# Patient Record
Sex: Male | Born: 1935 | Race: White | Hispanic: No
Health system: Southern US, Community
[De-identification: ages and names within clinical notes are randomized; demographics above are authoritative.]

## PROBLEM LIST (undated history)

## (undated) DIAGNOSIS — J309 Allergic rhinitis, unspecified: Secondary | ICD-10-CM

## (undated) DIAGNOSIS — Z87442 Personal history of urinary calculi: Secondary | ICD-10-CM

## (undated) DIAGNOSIS — E538 Deficiency of other specified B group vitamins: Secondary | ICD-10-CM

## (undated) DIAGNOSIS — Z8669 Personal history of other diseases of the nervous system and sense organs: Secondary | ICD-10-CM

## (undated) DIAGNOSIS — L4050 Arthropathic psoriasis, unspecified: Secondary | ICD-10-CM

## (undated) DIAGNOSIS — Z8521 Personal history of malignant neoplasm of larynx: Secondary | ICD-10-CM

## (undated) DIAGNOSIS — D649 Anemia, unspecified: Secondary | ICD-10-CM

## (undated) DIAGNOSIS — J449 Chronic obstructive pulmonary disease, unspecified: Secondary | ICD-10-CM

## (undated) DIAGNOSIS — J8410 Pulmonary fibrosis, unspecified: Secondary | ICD-10-CM

## (undated) DIAGNOSIS — N400 Enlarged prostate without lower urinary tract symptoms: Secondary | ICD-10-CM

## (undated) DIAGNOSIS — I10 Essential (primary) hypertension: Secondary | ICD-10-CM

## (undated) DIAGNOSIS — E559 Vitamin D deficiency, unspecified: Secondary | ICD-10-CM

## (undated) DIAGNOSIS — I739 Peripheral vascular disease, unspecified: Secondary | ICD-10-CM

## (undated) DIAGNOSIS — K219 Gastro-esophageal reflux disease without esophagitis: Secondary | ICD-10-CM

## (undated) DIAGNOSIS — E7800 Pure hypercholesterolemia, unspecified: Secondary | ICD-10-CM

## (undated) HISTORY — PX: HEMORRHOID SURGERY: SHX153

## (undated) HISTORY — PX: APPENDECTOMY: SHX54

## (undated) HISTORY — PX: OTHER SURGICAL HISTORY: SHX169

## (undated) HISTORY — PX: NECK SURGERY: SHX720

---

## 2019-01-06 ENCOUNTER — Encounter (HOSPITAL_COMMUNITY)
Admission: EM | Disposition: A | Discharge status: Home / Self Care | Payer: Self-pay | Source: Home / Self Care | Attending: Emergency Medicine

## 2019-01-06 ENCOUNTER — Inpatient Hospital Stay (HOSPITAL_COMMUNITY): Payer: Medicare Other

## 2019-01-06 ENCOUNTER — Emergency Department (HOSPITAL_COMMUNITY): Payer: Medicare Other

## 2019-01-06 ENCOUNTER — Inpatient Hospital Stay (HOSPITAL_COMMUNITY): Payer: Medicare Other | Admitting: Anesthesiology

## 2019-01-06 ENCOUNTER — Other Ambulatory Visit: Payer: Self-pay

## 2019-01-06 ENCOUNTER — Observation Stay (HOSPITAL_BASED_OUTPATIENT_CLINIC_OR_DEPARTMENT_OTHER)
Admission: EM | Admit: 2019-01-06 | Discharge: 2019-01-07 | Disposition: A | Discharge status: Home / Self Care | Payer: Medicare Other | Source: Home / Self Care | Attending: Emergency Medicine | Admitting: Emergency Medicine

## 2019-01-06 ENCOUNTER — Encounter (HOSPITAL_COMMUNITY): Payer: Self-pay | Admitting: Emergency Medicine

## 2019-01-06 DIAGNOSIS — I714 Abdominal aortic aneurysm, without rupture, unspecified: Secondary | ICD-10-CM

## 2019-01-06 DIAGNOSIS — Z1159 Encounter for screening for other viral diseases: Secondary | ICD-10-CM | POA: Insufficient documentation

## 2019-01-06 DIAGNOSIS — E559 Vitamin D deficiency, unspecified: Secondary | ICD-10-CM | POA: Insufficient documentation

## 2019-01-06 DIAGNOSIS — K219 Gastro-esophageal reflux disease without esophagitis: Secondary | ICD-10-CM | POA: Insufficient documentation

## 2019-01-06 DIAGNOSIS — I700 Atherosclerosis of aorta: Secondary | ICD-10-CM | POA: Insufficient documentation

## 2019-01-06 DIAGNOSIS — N179 Acute kidney failure, unspecified: Secondary | ICD-10-CM | POA: Insufficient documentation

## 2019-01-06 DIAGNOSIS — Z79899 Other long term (current) drug therapy: Secondary | ICD-10-CM | POA: Insufficient documentation

## 2019-01-06 DIAGNOSIS — I723 Aneurysm of iliac artery: Secondary | ICD-10-CM | POA: Insufficient documentation

## 2019-01-06 DIAGNOSIS — Z885 Allergy status to narcotic agent status: Secondary | ICD-10-CM | POA: Insufficient documentation

## 2019-01-06 DIAGNOSIS — F1010 Alcohol abuse, uncomplicated: Secondary | ICD-10-CM | POA: Insufficient documentation

## 2019-01-06 DIAGNOSIS — I701 Atherosclerosis of renal artery: Secondary | ICD-10-CM | POA: Insufficient documentation

## 2019-01-06 DIAGNOSIS — N132 Hydronephrosis with renal and ureteral calculous obstruction: Secondary | ICD-10-CM | POA: Insufficient documentation

## 2019-01-06 DIAGNOSIS — L4050 Arthropathic psoriasis, unspecified: Secondary | ICD-10-CM | POA: Insufficient documentation

## 2019-01-06 DIAGNOSIS — J8410 Pulmonary fibrosis, unspecified: Secondary | ICD-10-CM | POA: Insufficient documentation

## 2019-01-06 DIAGNOSIS — F0390 Unspecified dementia without behavioral disturbance: Secondary | ICD-10-CM | POA: Insufficient documentation

## 2019-01-06 DIAGNOSIS — N200 Calculus of kidney: Secondary | ICD-10-CM | POA: Diagnosis not present

## 2019-01-06 DIAGNOSIS — I739 Peripheral vascular disease, unspecified: Secondary | ICD-10-CM | POA: Insufficient documentation

## 2019-01-06 DIAGNOSIS — N261 Atrophy of kidney (terminal): Secondary | ICD-10-CM | POA: Insufficient documentation

## 2019-01-06 DIAGNOSIS — H3530 Unspecified macular degeneration: Secondary | ICD-10-CM | POA: Insufficient documentation

## 2019-01-06 DIAGNOSIS — Z7952 Long term (current) use of systemic steroids: Secondary | ICD-10-CM | POA: Insufficient documentation

## 2019-01-06 DIAGNOSIS — Z8521 Personal history of malignant neoplasm of larynx: Secondary | ICD-10-CM | POA: Insufficient documentation

## 2019-01-06 DIAGNOSIS — Z881 Allergy status to other antibiotic agents status: Secondary | ICD-10-CM | POA: Insufficient documentation

## 2019-01-06 DIAGNOSIS — I2510 Atherosclerotic heart disease of native coronary artery without angina pectoris: Secondary | ICD-10-CM | POA: Insufficient documentation

## 2019-01-06 DIAGNOSIS — I10 Essential (primary) hypertension: Secondary | ICD-10-CM | POA: Insufficient documentation

## 2019-01-06 DIAGNOSIS — N201 Calculus of ureter: Secondary | ICD-10-CM

## 2019-01-06 DIAGNOSIS — Z87891 Personal history of nicotine dependence: Secondary | ICD-10-CM | POA: Insufficient documentation

## 2019-01-06 DIAGNOSIS — J439 Emphysema, unspecified: Secondary | ICD-10-CM | POA: Insufficient documentation

## 2019-01-06 DIAGNOSIS — Z87442 Personal history of urinary calculi: Secondary | ICD-10-CM | POA: Insufficient documentation

## 2019-01-06 DIAGNOSIS — Z882 Allergy status to sulfonamides status: Secondary | ICD-10-CM | POA: Insufficient documentation

## 2019-01-06 HISTORY — DX: Allergic rhinitis, unspecified: J30.9

## 2019-01-06 HISTORY — DX: Personal history of urinary calculi: Z87.442

## 2019-01-06 HISTORY — DX: Personal history of other diseases of the nervous system and sense organs: Z86.69

## 2019-01-06 HISTORY — DX: Personal history of malignant neoplasm of larynx: Z85.21

## 2019-01-06 HISTORY — DX: Gastro-esophageal reflux disease without esophagitis: K21.9

## 2019-01-06 HISTORY — DX: Benign prostatic hyperplasia without lower urinary tract symptoms: N40.0

## 2019-01-06 HISTORY — DX: Vitamin D deficiency, unspecified: E55.9

## 2019-01-06 HISTORY — DX: Chronic obstructive pulmonary disease, unspecified: J44.9

## 2019-01-06 HISTORY — DX: Peripheral vascular disease, unspecified: I73.9

## 2019-01-06 HISTORY — DX: Deficiency of other specified B group vitamins: E53.8

## 2019-01-06 HISTORY — DX: Arthropathic psoriasis, unspecified: L40.50

## 2019-01-06 HISTORY — DX: Pure hypercholesterolemia, unspecified: E78.00

## 2019-01-06 HISTORY — DX: Essential (primary) hypertension: I10

## 2019-01-06 HISTORY — DX: Pulmonary fibrosis, unspecified: J84.10

## 2019-01-06 HISTORY — PX: CYSTOSCOPY W/ URETERAL STENT PLACEMENT: SHX1429

## 2019-01-06 HISTORY — DX: Anemia, unspecified: D64.9

## 2019-01-06 LAB — URINALYSIS, ROUTINE W REFLEX MICROSCOPIC
Bilirubin Urine: NEGATIVE
Glucose, UA: NEGATIVE mg/dL
Ketones, ur: NEGATIVE mg/dL
Leukocytes,Ua: NEGATIVE
Nitrite: NEGATIVE
Protein, ur: NEGATIVE mg/dL
RBC / HPF: >50 RBC/hpf — ABNORMAL HIGH (ref 0–5)
Specific Gravity, Urine: 1.024 (ref 1.005–1.030)
pH: 5 (ref 5.0–8.0)

## 2019-01-06 LAB — COMPREHENSIVE METABOLIC PANEL
ALT: 23 U/L (ref 0–44)
AST: 23 U/L (ref 15–41)
Albumin: 4.4 g/dL (ref 3.5–5.0)
Alkaline Phosphatase: 43 U/L (ref 38–126)
Anion gap: 10 (ref 5–15)
BUN: 25 mg/dL — ABNORMAL HIGH (ref 8–23)
CO2: 25 mmol/L (ref 22–32)
Calcium: 9.8 mg/dL (ref 8.9–10.3)
Chloride: 104 mmol/L (ref 98–111)
Creatinine, Ser: 1.74 mg/dL — ABNORMAL HIGH (ref 0.61–1.24)
GFR calc Af Amer: 41 mL/min — ABNORMAL LOW (ref >60)
GFR calc non Af Amer: 35 mL/min — ABNORMAL LOW (ref >60)
Glucose, Bld: 146 mg/dL — ABNORMAL HIGH (ref 70–99)
Potassium: 3.8 mmol/L (ref 3.5–5.1)
Sodium: 139 mmol/L (ref 135–145)
Total Bilirubin: 0.6 mg/dL (ref 0.3–1.2)
Total Protein: 8.1 g/dL (ref 6.5–8.1)

## 2019-01-06 LAB — TYPE AND SCREEN
ABO/RH(D): A POS
Antibody Screen: NEGATIVE

## 2019-01-06 LAB — LACTIC ACID, PLASMA: Lactic Acid, Venous: 1.5 mmol/L (ref 0.5–1.9)

## 2019-01-06 LAB — CBC
HCT: 43.7 % (ref 39.0–52.0)
Hemoglobin: 13.1 g/dL (ref 13.0–17.0)
MCH: 30.5 pg (ref 26.0–34.0)
MCHC: 30 g/dL (ref 30.0–36.0)
MCV: 101.6 fL — ABNORMAL HIGH (ref 80.0–100.0)
Platelets: 196 10*3/uL (ref 150–400)
RBC: 4.3 MIL/uL (ref 4.22–5.81)
RDW: 13.8 % (ref 11.5–15.5)
WBC: 8.7 10*3/uL (ref 4.0–10.5)
nRBC: 0 % (ref 0.0–0.2)

## 2019-01-06 LAB — MRSA PCR SCREENING: MRSA by PCR: NEGATIVE

## 2019-01-06 LAB — LIPASE, BLOOD: Lipase: 26 U/L (ref 11–51)

## 2019-01-06 LAB — ABO/RH: ABO/RH(D): A POS

## 2019-01-06 LAB — SARS CORONAVIRUS 2 BY RT PCR (HOSPITAL ORDER, PERFORMED IN ~~LOC~~ HOSPITAL LAB): SARS Coronavirus 2: NEGATIVE

## 2019-01-06 SURGERY — CYSTOSCOPY, WITH RETROGRADE PYELOGRAM AND URETERAL STENT INSERTION
Anesthesia: General | Laterality: Left

## 2019-01-06 MED ORDER — SODIUM CHLORIDE 0.9 % IR SOLN
Status: DC | PRN
  Administered 2019-01-06: 3000 mL via INTRAVESICAL

## 2019-01-06 MED ORDER — ALBUTEROL SULFATE (2.5 MG/3ML) 0.083% IN NEBU
2.5000 mg | INHALATION_SOLUTION | RESPIRATORY_TRACT | Status: DC | PRN
  Administered 2019-01-07: 2.5 mg via RESPIRATORY_TRACT
  Filled 2019-01-06: qty 3

## 2019-01-06 MED ORDER — SODIUM CHLORIDE (PF) 0.9 % IJ SOLN
INTRAMUSCULAR | Status: AC
  Filled 2019-01-06: qty 50

## 2019-01-06 MED ORDER — IOHEXOL 300 MG/ML  SOLN
INTRAMUSCULAR | Status: DC | PRN
  Administered 2019-01-06: 20:00:00 5 mL via URETHRAL

## 2019-01-06 MED ORDER — HEPARIN SODIUM (PORCINE) 5000 UNIT/ML IJ SOLN
5000.0000 [IU] | Freq: Three times a day (TID) | INTRAMUSCULAR | Status: DC
  Administered 2019-01-06 – 2019-01-07 (×2): 5000 [IU] via SUBCUTANEOUS
  Filled 2019-01-06 (×2): qty 1

## 2019-01-06 MED ORDER — SODIUM CHLORIDE 0.9% FLUSH: 3.0000 mL | Freq: Once | INTRAVENOUS | Status: DC

## 2019-01-06 MED ORDER — CEFAZOLIN SODIUM-DEXTROSE 2-4 GM/100ML-% IV SOLN
2.0000 g | Freq: Once | INTRAVENOUS | Status: AC
  Administered 2019-01-06: 2 g via INTRAVENOUS
  Filled 2019-01-06: qty 100

## 2019-01-06 MED ORDER — HYDROMORPHONE HCL 1 MG/ML IJ SOLN
1.0000 mg | Freq: Once | INTRAMUSCULAR | Status: AC
  Administered 2019-01-06: 1 mg via INTRAVENOUS
  Filled 2019-01-06: qty 1

## 2019-01-06 MED ORDER — HYDROMORPHONE HCL 1 MG/ML IJ SOLN
0.5000 mg | Freq: Four times a day (QID) | INTRAMUSCULAR | Status: DC | PRN
  Administered 2019-01-06 – 2019-01-07 (×3): 0.5 mg via INTRAVENOUS
  Filled 2019-01-06 (×3): qty 0.5

## 2019-01-06 MED ORDER — VITAMIN D 25 MCG (1000 UNIT) PO TABS
5000.0000 [IU] | ORAL_TABLET | Freq: Every day | ORAL | Status: DC
  Administered 2019-01-07: 5000 [IU] via ORAL
  Filled 2019-01-06 (×2): qty 5

## 2019-01-06 MED ORDER — ADULT MULTIVITAMIN W/MINERALS CH
1.0000 | ORAL_TABLET | Freq: Every day | ORAL | Status: DC
  Administered 2019-01-07: 1 via ORAL
  Filled 2019-01-06: qty 1

## 2019-01-06 MED ORDER — LORATADINE 10 MG PO TABS
10.0000 mg | ORAL_TABLET | Freq: Every day | ORAL | Status: DC
  Administered 2019-01-07: 10 mg via ORAL
  Filled 2019-01-06: qty 1

## 2019-01-06 MED ORDER — IOHEXOL 350 MG/ML SOLN
100.0000 mL | Freq: Once | INTRAVENOUS | Status: AC | PRN
  Administered 2019-01-06: 04:00:00 80 mL via INTRAVENOUS

## 2019-01-06 MED ORDER — LABETALOL HCL 5 MG/ML IV SOLN
5.0000 mg | Freq: Once | INTRAVENOUS | Status: AC
  Administered 2019-01-06: 5 mg via INTRAVENOUS

## 2019-01-06 MED ORDER — ACETAMINOPHEN 325 MG PO TABS: 650.0000 mg | ORAL_TABLET | Freq: Four times a day (QID) | ORAL | Status: DC | PRN

## 2019-01-06 MED ORDER — LORAZEPAM 2 MG/ML IJ SOLN
1.0000 mg | Freq: Four times a day (QID) | INTRAMUSCULAR | Status: DC | PRN
  Administered 2019-01-06: 1 mg via INTRAVENOUS
  Filled 2019-01-06: qty 1

## 2019-01-06 MED ORDER — ACETAMINOPHEN 650 MG RE SUPP: 650.0000 mg | Freq: Four times a day (QID) | RECTAL | Status: DC | PRN

## 2019-01-06 MED ORDER — LABETALOL HCL 5 MG/ML IV SOLN
INTRAVENOUS | Status: AC
  Administered 2019-01-06: 5 mg via INTRAVENOUS
  Filled 2019-01-06: qty 4

## 2019-01-06 MED ORDER — LOSARTAN POTASSIUM 25 MG PO TABS: 25.0000 mg | ORAL_TABLET | Freq: Every day | ORAL | Status: DC

## 2019-01-06 MED ORDER — ONDANSETRON HCL 4 MG/2ML IJ SOLN
4.0000 mg | Freq: Four times a day (QID) | INTRAMUSCULAR | Status: DC | PRN
  Administered 2019-01-06: 4 mg via INTRAVENOUS

## 2019-01-06 MED ORDER — FENTANYL CITRATE (PF) 100 MCG/2ML IJ SOLN
INTRAMUSCULAR | Status: AC
  Filled 2019-01-06: qty 2

## 2019-01-06 MED ORDER — HYDRALAZINE HCL 20 MG/ML IJ SOLN
10.0000 mg | Freq: Three times a day (TID) | INTRAMUSCULAR | Status: DC | PRN
  Administered 2019-01-06: 10 mg via INTRAVENOUS
  Filled 2019-01-06: qty 1

## 2019-01-06 MED ORDER — THIAMINE HCL 100 MG/ML IJ SOLN: 100.0000 mg | Freq: Every day | INTRAMUSCULAR | Status: DC

## 2019-01-06 MED ORDER — FENTANYL CITRATE (PF) 100 MCG/2ML IJ SOLN
INTRAMUSCULAR | Status: DC | PRN
  Administered 2019-01-06: 25 ug via INTRAVENOUS
  Administered 2019-01-06: 50 ug via INTRAVENOUS
  Administered 2019-01-06: 25 ug via INTRAVENOUS

## 2019-01-06 MED ORDER — SODIUM CHLORIDE 0.9 % IV SOLN
INTRAVENOUS | Status: DC | PRN
  Administered 2019-01-06: 19:00:00 20 ug/min via INTRAVENOUS

## 2019-01-06 MED ORDER — VITAMIN B-1 100 MG PO TABS
100.0000 mg | ORAL_TABLET | Freq: Every day | ORAL | Status: DC
  Administered 2019-01-07: 100 mg via ORAL
  Filled 2019-01-06: qty 1

## 2019-01-06 MED ORDER — SODIUM CHLORIDE 0.9 % IV SOLN
1.0000 g | INTRAVENOUS | Status: DC
  Filled 2019-01-06: qty 10

## 2019-01-06 MED ORDER — PREDNISONE 5 MG PO TABS
10.0000 mg | ORAL_TABLET | Freq: Every day | ORAL | Status: DC
  Administered 2019-01-07: 10 mg via ORAL
  Filled 2019-01-06: qty 2

## 2019-01-06 MED ORDER — TRAMADOL HCL 50 MG PO TABS: 50.0000 mg | ORAL_TABLET | Freq: Two times a day (BID) | ORAL | Status: DC | PRN

## 2019-01-06 MED ORDER — PANTOPRAZOLE SODIUM 40 MG PO TBEC: 40.0000 mg | DELAYED_RELEASE_TABLET | Freq: Every day | ORAL | Status: DC

## 2019-01-06 MED ORDER — SODIUM CHLORIDE 0.9 % IV SOLN
INTRAVENOUS | Status: DC
  Administered 2019-01-06 (×2): via INTRAVENOUS

## 2019-01-06 MED ORDER — LACTATED RINGERS IV SOLN
INTRAVENOUS | Status: DC
  Administered 2019-01-06: 18:00:00 via INTRAVENOUS

## 2019-01-06 MED ORDER — GUAIFENESIN ER 600 MG PO TB12
1200.0000 mg | ORAL_TABLET | Freq: Every day | ORAL | Status: DC
  Administered 2019-01-07: 1200 mg via ORAL
  Filled 2019-01-06: qty 2

## 2019-01-06 MED ORDER — DOCUSATE SODIUM 100 MG PO CAPS: 100.0000 mg | ORAL_CAPSULE | Freq: Every evening | ORAL | Status: DC | PRN

## 2019-01-06 MED ORDER — LORAZEPAM 1 MG PO TABS: 1.0000 mg | ORAL_TABLET | Freq: Four times a day (QID) | ORAL | Status: DC | PRN

## 2019-01-06 MED ORDER — SODIUM CHLORIDE 0.9 % IV BOLUS
500.0000 mL | Freq: Once | INTRAVENOUS | Status: AC
  Administered 2019-01-06: 500 mL via INTRAVENOUS

## 2019-01-06 MED ORDER — SENNOSIDES-DOCUSATE SODIUM 8.6-50 MG PO TABS: 1.0000 | ORAL_TABLET | Freq: Every evening | ORAL | Status: DC | PRN

## 2019-01-06 MED ORDER — MYCOPHENOLATE MOFETIL 250 MG PO CAPS
1000.0000 mg | ORAL_CAPSULE | Freq: Two times a day (BID) | ORAL | Status: DC
  Administered 2019-01-07: 1000 mg via ORAL
  Filled 2019-01-06 (×3): qty 4

## 2019-01-06 MED ORDER — DEXAMETHASONE SODIUM PHOSPHATE 10 MG/ML IJ SOLN
INTRAMUSCULAR | Status: DC | PRN
  Administered 2019-01-06: 4 mg via INTRAVENOUS

## 2019-01-06 MED ORDER — PROPOFOL 10 MG/ML IV BOLUS
INTRAVENOUS | Status: DC | PRN
  Administered 2019-01-06: 120 mg via INTRAVENOUS

## 2019-01-06 MED ORDER — ONDANSETRON HCL 4 MG PO TABS: 4.0000 mg | ORAL_TABLET | Freq: Four times a day (QID) | ORAL | Status: DC | PRN

## 2019-01-06 MED ORDER — LIDOCAINE 2% (20 MG/ML) 5 ML SYRINGE
INTRAMUSCULAR | Status: DC | PRN
  Administered 2019-01-06: 100 mg via INTRAVENOUS

## 2019-01-06 MED ORDER — FOLIC ACID 1 MG PO TABS
1.0000 mg | ORAL_TABLET | Freq: Every day | ORAL | Status: DC
  Administered 2019-01-07: 1 mg via ORAL
  Filled 2019-01-06: qty 1

## 2019-01-06 SURGICAL SUPPLY — 16 items
BAG URO CATCHER STRL LF (MISCELLANEOUS) ×3 IMPLANT
BASKET ZERO TIP NITINOL 2.4FR (BASKET) IMPLANT
CATH INTERMIT  6FR 70CM (CATHETERS) IMPLANT
CLOTH BEACON ORANGE TIMEOUT ST (SAFETY) ×3 IMPLANT
COVER WAND RF STERILE (DRAPES) IMPLANT
GLOVE BIOGEL M STRL SZ7.5 (GLOVE) ×7 IMPLANT
GOWN STRL REUS W/TWL LRG LVL3 (GOWN DISPOSABLE) ×6 IMPLANT
GUIDEWIRE ANG ZIPWIRE 038X150 (WIRE) ×3 IMPLANT
GUIDEWIRE STR DUAL SENSOR (WIRE) ×2 IMPLANT
KIT TURNOVER KIT A (KITS) IMPLANT
MANIFOLD NEPTUNE II (INSTRUMENTS) ×3 IMPLANT
PACK CYSTO (CUSTOM PROCEDURE TRAY) ×3 IMPLANT
STENT POLARIS 5FRX26 (STENTS) ×2 IMPLANT
TUBING CONNECTING 10 (TUBING) ×2 IMPLANT
TUBING CONNECTING 10' (TUBING) ×1
TUBING UROLOGY SET (TUBING) ×2 IMPLANT

## 2019-01-06 NOTE — Anesthesia Preprocedure Evaluation (Addendum)
Anesthesia Evaluation  Patient identified by MRN, date of birth, ID band Patient awake    Reviewed: Allergy & Precautions, NPO status , Patient's Chart, lab work & pertinent test results  History of Anesthesia Complications Negative for: history of anesthetic complications  Airway Mallampati: III  TM Distance: <3 FB Neck ROM: Limited    Dental   Pulmonary sleep apnea , COPD, former smoker,  Pulmonary fibrosis    + wheezing      Cardiovascular hypertension, + Peripheral Vascular Disease  Normal cardiovascular exam     Neuro/Psych negative psych ROS   GI/Hepatic GERD  ,(+)     substance abuse  alcohol use,   Endo/Other  negative endocrine ROS  Renal/GU Renal InsufficiencyRenal disease  negative genitourinary   Musculoskeletal  (+) Arthritis ,   Abdominal   Peds  Hematology negative hematology ROS (+)   Anesthesia Other Findings HTN, COPD w/ 150 PY hx (home O2 2-3.5 L), AAA/PVD, nephrolithiasis, psoriatic arthritis, GERD, AKI on CKD, EtOH use, h/o cervical fusion, h/o laryngeal cancer s/p surgical removal (no XRT)  Echo 07/09/18: EF >65%, RVSP 40-45, no significant valve abnormality  Reproductive/Obstetrics                            Anesthesia Physical Anesthesia Plan  ASA: III  Anesthesia Plan: General   Post-op Pain Management:    Induction: Intravenous  PONV Risk Score and Plan: 2 and Ondansetron, Dexamethasone and Treatment may vary due to age or medical condition  Airway Management Planned: LMA  Additional Equipment: None  Intra-op Plan:   Post-operative Plan: Extubation in OR  Informed Consent: I have reviewed the patients History and Physical, chart, labs and discussed the procedure including the risks, benefits and alternatives for the proposed anesthesia with the patient or authorized representative who has indicated his/her understanding and acceptance.     Dental  advisory given  Plan Discussed with:   Anesthesia Plan Comments: (Pt has reportedly had some confusion but at time of my visit he was awake, alert, understood he was having surgery and why, and was able to answer all questions appropriately and so is adequately competent to consent for surgery. )        Anesthesia Quick Evaluation

## 2019-01-06 NOTE — ED Notes (Signed)
Pt cut his monitor off because the "beeping was too loud". Pt informed it was beeping because his oxygen level was reading low. Pt aware he needs to wear his oxygen. Pt placed his oxygen back in his nose and laid back in the bed.

## 2019-01-06 NOTE — Progress Notes (Signed)
Pt wife reports pt has had multiple falls at home over the past few days. RN mentioned possibility of home health, in the past the pt has refused. Pt wife is in the room with pt and said they are open to hearing about HH options this admission.

## 2019-01-06 NOTE — Consult Note (Signed)
Urology Consult Note   Requesting Attending Physician:  Varney Biles, MD Service Providing Consult: Urology  Consulting Attending: Dr. Tresa Moore   Reason for Consult: Nephrolithiasis  HPI: Tanner Cox. is seen in consultation for reasons noted above at the request of Varney Biles, MD for evaluation of ureterolithiasis.  This is a 85 y.o. unhealthy male with past medical history of COPD, AAA, CAD, and nephrolithiasis who presents for left-sided flank pain.  CT scan shows 1 cm left UPJ stone and a atrophic right kidney.  He essentially has a solitary kidney.  No other concern for infection on urinalysis or labs.  Creatinine 1.7 from baseline 1.5 in care everywhere  Due to the essentially solitary kidney he meets criteria for decompression with stent  Past Medical History: COPD, AAA, CAD, and nephrolithiasis  Past Surgical History:  Multiple lithotripsy procedures locally in [place]  Medication: Current Facility-Administered Medications  Medication Dose Route Frequency Provider Last Rate Last Dose  . sodium chloride (PF) 0.9 % injection        Stopped at 01/06/19 0730  . sodium chloride flush (NS) 0.9 % injection 3 mL  3 mL Intravenous Once Varney Biles, MD       Current Outpatient Medications  Medication Sig Dispense Refill  . cetirizine (ZYRTEC) 10 MG tablet Take 10 mg by mouth at bedtime.    . Cholecalciferol (VITAMIN D) 125 MCG (5000 UT) CAPS Take 5,000 Units by mouth daily.    . Cyanocobalamin (VITAMIN B-12 IJ) Inject 1 mL as directed every 28 (twenty-eight) days.    Marland Kitchen docusate sodium (COLACE) 100 MG capsule Take 100 mg by mouth at bedtime as needed for mild constipation.    . Guaifenesin (MUCINEX MAXIMUM STRENGTH) 1200 MG TB12 Take 1,200 mg by mouth daily.    Marland Kitchen losartan (COZAAR) 25 MG tablet Take 25 mg by mouth daily.    . mycophenolate (CELLCEPT) 500 MG tablet Take 1,000 mg by mouth 2 (two) times daily.    . pantoprazole (PROTONIX) 40 MG tablet Take 40 mg by  mouth daily.    . predniSONE (DELTASONE) 10 MG tablet Take 10 mg by mouth daily with breakfast.    . Secukinumab, 300 MG Dose, (COSENTYX, 300 MG DOSE,) 150 MG/ML SOSY Inject 300 mg into the skin every 28 (twenty-eight) days.    . traMADol (ULTRAM) 50 MG tablet Take 50 mg by mouth every 12 (twelve) hours as needed for moderate pain.      Allergies: Allergies  Allergen Reactions  . Oxycodone-Acetaminophen Nausea And Vomiting  . Codeine Rash  . Sulfamethoxazole-Trimethoprim Nausea Only    Social History: Social History   Tobacco Use  . Smoking status: Never Smoker  . Smokeless tobacco: Never Used  Substance Use Topics  . Alcohol use: Not on file  . Drug use: Not on file    Family History No family history on file.  Review of Systems 10 systems were reviewed and are negative except as noted specifically in the HPI.  Objective   Vital signs in last 24 hours: BP (!) 137/93 (BP Location: Left Arm)   Pulse (!) 59   Resp 20   SpO2 94%   Physical Exam General: Disoriented, hard of hearing Pulmonary: Increased work of breathing Cardiovascular: HDS, adequate peripheral perfusion Abdomen: Tender in LLQ GU: No foley, Left CVA tenderness Extremities: warm and well perfused  Most Recent Labs: Lab Results  Component Value Date   WBC 8.7 01/06/2019   HGB 13.1 01/06/2019   HCT 43.7  01/06/2019   PLT 196 01/06/2019    Lab Results  Component Value Date   NA 139 01/06/2019   K 3.8 01/06/2019   CL 104 01/06/2019   CO2 25 01/06/2019   BUN 25 (H) 01/06/2019   CREATININE 1.74 (H) 01/06/2019   CALCIUM 9.8 01/06/2019    No results found for: INR, APTT   IMAGING: Ct Angio Chest/abd/pel For Dissection W And/or W/wo  Result Date: 01/06/2019 CLINICAL DATA:  Severe abdominal pain and bloating for several hours. Personal history of abdominal aortic aneurysm. EXAM: CT ANGIOGRAPHY CHEST, ABDOMEN AND PELVIS TECHNIQUE: Multidetector CT imaging through the chest, abdomen and pelvis  was performed using the standard protocol during bolus administration of intravenous contrast. Multiplanar reconstructed images and MIPs were obtained and reviewed to evaluate the vascular anatomy. CONTRAST:  80mL OMNIPAQUE IOHEXOL 350 MG/ML SOLN COMPARISON:  None. FINDINGS: CTA CHEST FINDINGS Cardiovascular: The heart size is normal. Dense atherosclerotic calcifications are present within the coronary arteries. Nondisplaced calcifications are present at the aortic arch and origins of the great vessels. There is no aneurysmal dilation of the aortic arch or significant stenosis of the great vessel origins. Atherosclerotic irregularity is present within the descending thoracic aorta without aneurysmal dilation. Mediastinum/Nodes: Subcentimeter right paratracheal lymph nodes are present superiorly. No significant adenopathy is present. Thoracic inlet is within normal limits. Thyroid is atrophic. Esophagus is unremarkable. Lungs/Pleura: Paraseptal and centrilobular emphysematous changes are present. There is moderate dependent atelectasis. Peripheral fibrotic changes are noted at the bases. No mass lesion is present. There is no pneumothorax. Musculoskeletal: Vertebral body heights and alignment are maintained. No acute or healing fractures are present. Ribs are within normal limits. Sternum is unremarkable. Review of the MIP images confirms the above findings. CTA ABDOMEN AND PELVIS FINDINGS VASCULAR Aorta: Extensive atherosclerotic changes are present with mural plaque and calcifications. Transverse diameter of the aorta just above the superior mesenteric artery measures 4.2 cm. Lower abdominal aortic aneurysm measures 4.1 cm on the sagittal images. There is no dissection or occlusion. Celiac: Atherosclerotic calcifications are present at the origin there is no significant stenosis of greater than 50%. Branch vessels are unremarkable. SMA: Atherosclerotic changes are present at the origin without significant stenosis.  Distal branch vessels are unremarkable. Renals: Atherosclerotic calcifications are present at the origin of the renal arteries bilaterally. There is near occlusive stenosis on the right. A 60% stenosis is present on the left. Distal calcifications extend into the kidneys bilaterally. IMA: Patent without evidence of aneurysm, dissection, vasculitis or significant stenosis. Inflow: Extensive atherosclerotic calcifications are present in the iliac arteries bilaterally. There is borderline aneurysmal dilation of the proximal common iliac arteries measuring 15 mm on the right and 16 mm on the left. 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 A near occlusive stenosis is present in the right external iliac artery. Narrowing of the left external iliac artery is less than 50%. Veins: No obvious venous abnormality within the limitations of this arterial phase study. Review of the MIP images confirms the above findings. NON-VASCULAR Hepatobiliary: No focal liver abnormality is seen. No gallstones, gallbladder wall thickening, or biliary dilatation. Pancreas: Pancreas is diffusely atrophic. No mass lesion or cyst is present. There is no duct dilation. Spleen: Normal in size without focal abnormality. Adrenals/Urinary Tract: If adrenal glands are normal bilaterally. The right kidney is markedly atrophic. A punctate nonobstructing stone is present at the lower pole. There is mild dilation of the left renal collecting system. A 16 mm stone is present at  the left UPJ. An additional punctate nonobstructing stone is present at the lower pole of the left kidney. The left ureter is otherwise normal. The urinary bladder is within normal limits. Stomach/Bowel: The stomach and duodenum are within normal limits. Small bowel is unremarkable. Terminal ileum is normal. The ascending and transverse colon are within normal limits. Descending colon is normal. Sigmoid colon extends into the left inguinal canal. It is otherwise within  normal limits. There is no obstruction or inflammation. Lymphatic: No significant retroperitoneal adenopathy is present. Reproductive: Calcifications are present in the prostate. No mass lesion is present. There is no significant enlargement. Other: A loop of sigmoid colon herniates into the left inguinal canal without obstruction. Fat herniates into the right inguinal canal without associated bowel. Musculoskeletal: Vertebral body heights alignment are maintained. No focal lytic or blastic lesions are present. Bony pelvis is within normal limits. Hips are located and normal. Review of the MIP images confirms the above findings. IMPRESSION: 1. No acute abnormality of the aorta. 2. Aortic Atherosclerosis (ICD10-I70.0). Diffuse calcifications and mural plaque present throughout the thoracic and abdominal aorta. 3. Abdominal aortic aneurysm measures 4.2 cm at the level of the superior mesenteric artery and 4.1 cm just above the bifurcation. 4. Or lying aneurysmal dilation of the common iliac arteries bilaterally. 5. 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 6. High-grade, near occlusive, stenosis of the right renal artery with associated right-sided renal atrophy. 7. At least 60% stenosis of the left renal artery. 8. High-grade stenosis of the right external iliac artery. 9.  Emphysema (ICD10-J43.9). 10. Dependent atelectasis and probable mild edema. 11. Extensive coronary artery disease. 12. 16 mm stone at the left UPJ with mild left-sided hydronephrosis. 13. Additional punctate nonobstructing stones at the lower pole of both kidneys. 14. Reactive type mediastinal lymph nodes. 15. Marked pancreatic atrophy. 16. Bilateral inguinal hernias. The left inguinal hernia contains a loop of sigmoid colon without obstruction. Electronically Signed   By: Marin Robertshristopher  Mattern M.D.   On: 01/06/2019 05:08    ------  Assessment:  85 y.o. unhealthy male with solitary kidney and nephrolithiasis.   Creatinine is near baseline but due to solitary kidney is not safe to discharge him with obstructing stone.   Recommendations: -Please keep n.p.o. -Posted for left ureteral stent placement this afternoon -Please reach out to hospitalist for admission given significant medical comorbidities -He will need an additional stone surgery to remove the stone at a later date.  Here versus locally   Thank you for this consult. Please contact the urology consult pager with any further questions/concerns.

## 2019-01-06 NOTE — Anesthesia Procedure Notes (Signed)
Procedure Name: LMA Insertion Date/Time: 01/06/2019 7:22 PM Performed by: Silas Sacramento, CRNA Pre-anesthesia Checklist: Patient identified, Emergency Drugs available, Suction available and Patient being monitored Patient Re-evaluated:Patient Re-evaluated prior to induction Oxygen Delivery Method: Circle system utilized Preoxygenation: Pre-oxygenation with 100% oxygen Induction Type: IV induction Ventilation: Mask ventilation without difficulty LMA: LMA with gastric port inserted LMA Size: 4.5 Tube type: Oral Number of attempts: 1 Airway Equipment and Method: Stylet and Oral airway Placement Confirmation: ETT inserted through vocal cords under direct vision,  positive ETCO2 and breath sounds checked- equal and bilateral Tube secured with: Tape Dental Injury: Teeth and Oropharynx as per pre-operative assessment

## 2019-01-06 NOTE — ED Notes (Signed)
Patient wife will go home, pack a bag and shower then come directly to [facility]. RN has instructed patient that once upstairs, patient may not leave.

## 2019-01-06 NOTE — ED Notes (Signed)
Call received pt wife Nikolas Casher [phone] requesting pt status/updates when possible. RN advised. [employer]

## 2019-01-06 NOTE — ED Notes (Signed)
Report given to Abby RN for 4E, Room 1408. RN and Agricultural consultant have verified it medically neccessary for wife to come with patient.

## 2019-01-06 NOTE — Brief Op Note (Signed)
01/06/2019  7:33 PM  PATIENT:  Tanner Cox.  85 y.o. male  PRE-OPERATIVE DIAGNOSIS:  LEFT URETERAL OBSTRUCTION  POST-OPERATIVE DIAGNOSIS:  left ureteral obstruction  PROCEDURE:  Procedure(s): CYSTOSCOPY WITH LEFT  RETROGRADE PYELOGRAM/URETERAL STENT PLACEMENT (Left)  SURGEON:  Surgeon(s) and Role:    * Alexis Frock, MD - Primary  PHYSICIAN ASSISTANT:   ASSISTANTS: none   ANESTHESIA:   general  EBL:  minimal   BLOOD ADMINISTERED:none  DRAINS: none   LOCAL MEDICATIONS USED:  NONE  SPECIMEN:  No Specimen  DISPOSITION OF SPECIMEN:  N/A  COUNTS:  YES  TOURNIQUET:  * No tourniquets in log *  DICTATION: .Other Dictation: Dictation Number [id number]  PLAN OF CARE: Admit to inpatient   PATIENT DISPOSITION:  PACU - hemodynamically stable.   Delay start of Pharmacological VTE agent (>24hrs) due to surgical blood loss or risk of bleeding: yes

## 2019-01-06 NOTE — ED Provider Notes (Signed)
[facility]-EMERGENCY DEPT Provider Note   CSN: [id number] Arrival date & time: 01/06/19  0151    History   Chief Complaint Chief Complaint  Patient presents with   Abdominal Pain    HPI Tanner [name] Pentecost Jr. is a 85 y.o. male.     HPI  85 year old male comes in a chief complaint of abdominal pain.  Patient reports sudden onset abdominal pain that is radiating to the back.  He has no history of similar pain in the past.  His medical history includes thoracic artery aneurysm and prior history of kidney stones.  Patient denies any nausea, vomiting, diarrhea, UTI-like symptoms.   History reviewed. No pertinent past medical history.  There are no active problems to display for this patient.   History reviewed. No pertinent surgical history.      Home Medications    Prior to Admission medications   Medication Sig Start Date End Date Taking? Authorizing Provider  cetirizine (ZYRTEC) 10 MG tablet Take 10 mg by mouth at bedtime.   Yes [provider]  Cholecalciferol (VITAMIN D) 125 MCG (5000 UT) CAPS Take 5,000 Units by mouth daily.   Yes [provider]  Cyanocobalamin (VITAMIN B-12 IJ) Inject 1 mL as directed every 28 (twenty-eight) days.   Yes [provider]  docusate sodium (COLACE) 100 MG capsule Take 100 mg by mouth at bedtime as needed for mild constipation.   Yes [provider]  Guaifenesin (MUCINEX MAXIMUM STRENGTH) 1200 MG TB12 Take 1,200 mg by mouth daily.   Yes [provider]  losartan (COZAAR) 25 MG tablet Take 25 mg by mouth daily.   Yes [provider]  mycophenolate (CELLCEPT) 500 MG tablet Take 1,000 mg by mouth 2 (two) times daily.   Yes [provider]  pantoprazole (PROTONIX) 40 MG tablet Take 40 mg by mouth daily.   Yes [provider]  predniSONE (DELTASONE) 10 MG tablet Take 10 mg by mouth daily with breakfast.   Yes [provider]  Secukinumab, 300  MG Dose, (COSENTYX, 300 MG DOSE,) 150 MG/ML SOSY Inject 300 mg into the skin every 28 (twenty-eight) days.   Yes [provider]  traMADol (ULTRAM) 50 MG tablet Take 50 mg by mouth every 12 (twelve) hours as needed for moderate pain.   Yes [provider]    Family History No family history on file.  Social History Social History   Tobacco Use   Smoking status: Never Smoker   Smokeless tobacco: Never Used  Substance Use Topics   Alcohol use: Not on file   Drug use: Not on file     Allergies   Oxycodone-acetaminophen, Codeine, and Sulfamethoxazole-trimethoprim   Review of Systems Review of Systems  Constitutional: Positive for activity change.  Gastrointestinal: Positive for abdominal pain.  Genitourinary: Positive for flank pain.  Allergic/Immunologic: Negative for immunocompromised state.  Hematological: Does not bruise/bleed easily.  All other systems reviewed and are negative.    Physical Exam Updated Vital Signs BP (!) 182/96    Pulse (!) 117    Resp (!) 21    SpO2 98%   Physical Exam Vitals signs and nursing note reviewed.  Constitutional:      Appearance: He is well-developed.  HENT:     Head: Atraumatic.  Neck:     Musculoskeletal: Neck supple.  Cardiovascular:     Rate and Rhythm: Tachycardia present.  Pulmonary:     Effort: Pulmonary effort is normal.  Abdominal:  Tenderness: There is abdominal tenderness in the right upper quadrant, epigastric area and left upper quadrant.  Skin:    General: Skin is warm.  Neurological:     Mental Status: He is alert and oriented to person, place, and time.      ED Treatments / Results  Labs (all labs ordered are listed, but only abnormal results are displayed) Labs Reviewed  COMPREHENSIVE METABOLIC PANEL - Abnormal; Notable for the following components:      Result Value   Glucose, Bld 146 (*)    BUN 25 (*)    Creatinine, Ser 1.74 (*)    GFR calc non Af Amer 35 (*)    GFR calc  Af Amer 41 (*)    All other components within normal limits  CBC - Abnormal; Notable for the following components:   MCV 101.6 (*)    All other components within normal limits  URINALYSIS, ROUTINE W REFLEX MICROSCOPIC - Abnormal; Notable for the following components:   Hgb urine dipstick LARGE (*)    RBC / HPF >50 (*)    Bacteria, UA RARE (*)    All other components within normal limits  SARS CORONAVIRUS 2 (HOSPITAL ORDER, [facility])  LIPASE, BLOOD  LACTIC ACID, PLASMA  LACTIC ACID, PLASMA  TYPE AND SCREEN  ABO/RH    EKG EKG Interpretation  Date/Time:  Wednesday January 06 2019 02:17:52 EDT Ventricular Rate:  118 PR Interval:    QRS Duration: 90 QT Interval:  318 QTC Calculation: 446 R Axis:   50 Text Interpretation:  Sinus tachycardia Atrial premature complexes Nonspecific T abnormalities, lateral leads No acute changes Confirmed by Varney Biles [phone]) on 01/06/2019 3:36:47 AM   Radiology Ct Angio Chest/abd/pel For Dissection W And/or W/wo  Result Date: 01/06/2019 CLINICAL DATA:  Severe abdominal pain and bloating for several hours. Personal history of abdominal aortic aneurysm. EXAM: CT ANGIOGRAPHY CHEST, ABDOMEN AND PELVIS TECHNIQUE: Multidetector CT imaging through the chest, abdomen and pelvis was performed using the standard protocol during bolus administration of intravenous contrast. Multiplanar reconstructed images and MIPs were obtained and reviewed to evaluate the vascular anatomy. CONTRAST:  47mL OMNIPAQUE IOHEXOL 350 MG/ML SOLN COMPARISON:  None. FINDINGS: CTA CHEST FINDINGS Cardiovascular: The heart size is normal. Dense atherosclerotic calcifications are present within the coronary arteries. Nondisplaced calcifications are present at the aortic arch and origins of the great vessels. There is no aneurysmal dilation of the aortic arch or significant stenosis of the great vessel origins. Atherosclerotic irregularity is present within the  descending thoracic aorta without aneurysmal dilation. Mediastinum/Nodes: Subcentimeter right paratracheal lymph nodes are present superiorly. No significant adenopathy is present. Thoracic inlet is within normal limits. Thyroid is atrophic. Esophagus is unremarkable. Lungs/Pleura: Paraseptal and centrilobular emphysematous changes are present. There is moderate dependent atelectasis. Peripheral fibrotic changes are noted at the bases. No mass lesion is present. There is no pneumothorax. Musculoskeletal: Vertebral body heights and alignment are maintained. No acute or healing fractures are present. Ribs are within normal limits. Sternum is unremarkable. Review of the MIP images confirms the above findings. CTA ABDOMEN AND PELVIS FINDINGS VASCULAR Aorta: Extensive atherosclerotic changes are present with mural plaque and calcifications. Transverse diameter of the aorta just above the superior mesenteric artery measures 4.2 cm. Lower abdominal aortic aneurysm measures 4.1 cm on the sagittal images. There is no dissection or occlusion. Celiac: Atherosclerotic calcifications are present at the origin there is no significant stenosis of greater than 50%. Branch vessels are unremarkable. SMA:  Atherosclerotic changes are present at the origin without significant stenosis. Distal branch vessels are unremarkable. Renals: Atherosclerotic calcifications are present at the origin of the renal arteries bilaterally. There is near occlusive stenosis on the right. A 60% stenosis is present on the left. Distal calcifications extend into the kidneys bilaterally. IMA: Patent without evidence of aneurysm, dissection, vasculitis or significant stenosis. Inflow: Extensive atherosclerotic calcifications are present in the iliac arteries bilaterally. There is borderline aneurysmal dilation of the proximal common iliac arteries measuring 15 mm on the right and 16 mm on the left. 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 A near  occlusive stenosis is present in the right external iliac artery. Narrowing of the left external iliac artery is less than 50%. Veins: No obvious venous abnormality within the limitations of this arterial phase study. Review of the MIP images confirms the above findings. NON-VASCULAR Hepatobiliary: No focal liver abnormality is seen. No gallstones, gallbladder wall thickening, or biliary dilatation. Pancreas: Pancreas is diffusely atrophic. No mass lesion or cyst is present. There is no duct dilation. Spleen: Normal in size without focal abnormality. Adrenals/Urinary Tract: If adrenal glands are normal bilaterally. The right kidney is markedly atrophic. A punctate nonobstructing stone is present at the lower pole. There is mild dilation of the left renal collecting system. A 16 mm stone is present at the left UPJ. An additional punctate nonobstructing stone is present at the lower pole of the left kidney. The left ureter is otherwise normal. The urinary bladder is within normal limits. Stomach/Bowel: The stomach and duodenum are within normal limits. Small bowel is unremarkable. Terminal ileum is normal. The ascending and transverse colon are within normal limits. Descending colon is normal. Sigmoid colon extends into the left inguinal canal. It is otherwise within normal limits. There is no obstruction or inflammation. Lymphatic: No significant retroperitoneal adenopathy is present. Reproductive: Calcifications are present in the prostate. No mass lesion is present. There is no significant enlargement. Other: A loop of sigmoid colon herniates into the left inguinal canal without obstruction. Fat herniates into the right inguinal canal without associated bowel. Musculoskeletal: Vertebral body heights alignment are maintained. No focal lytic or blastic lesions are present. Bony pelvis is within normal limits. Hips are located and normal. Review of the MIP images confirms the above findings. IMPRESSION: 1. No acute  abnormality of the aorta. 2. Aortic Atherosclerosis (ICD10-I70.0). Diffuse calcifications and mural plaque present throughout the thoracic and abdominal aorta. 3. Abdominal aortic aneurysm measures 4.2 cm at the level of the superior mesenteric artery and 4.1 cm just above the bifurcation. 4. Or lying aneurysmal dilation of the common iliac arteries bilaterally. 5. 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 6. High-grade, near occlusive, stenosis of the right renal artery with associated right-sided renal atrophy. 7. At least 60% stenosis of the left renal artery. 8. High-grade stenosis of the right external iliac artery. 9.  Emphysema (ICD10-J43.9). 10. Dependent atelectasis and probable mild edema. 11. Extensive coronary artery disease. 12. 16 mm stone at the left UPJ with mild left-sided hydronephrosis. 13. Additional punctate nonobstructing stones at the lower pole of both kidneys. 14. Reactive type mediastinal lymph nodes. 15. Marked pancreatic atrophy. 16. Bilateral inguinal hernias. The left inguinal hernia contains a loop of sigmoid colon without obstruction. Electronically Signed   By: Marin Robertshristopher  Mattern M.D.   On: 01/06/2019 05:08    Procedures Procedures (including critical care time)  Medications Ordered in ED Medications  sodium chloride flush (NS)  0.9 % injection 3 mL (3 mLs Intravenous Not Given 01/06/19 0218)  sodium chloride (PF) 0.9 % injection (has no administration in time range)  HYDROmorphone (DILAUDID) injection 1 mg (1 mg Intravenous Given 01/06/19 0326)  iohexol (OMNIPAQUE) 350 MG/ML injection 100 mL (80 mLs Intravenous Contrast Given 01/06/19 0348)  sodium chloride 0.9 % bolus 500 mL (500 mLs Intravenous New Bag/Given 01/06/19 0608)     Initial Impression / Assessment and Plan / ED Course  I have reviewed the triage vital signs and the nursing notes.  Pertinent labs & imaging results that were available during my care of the patient were reviewed by  me and considered in my medical decision making (see chart for details).        85 year old male comes in a chief complaint of abdominal pain that is radiating to the back.  He has known history of thoracic artery aneurysm.  Patient is noted to be tachycardic and hypertensive.  His vascular exam is unremarkable, patient has no neurologic symptoms and grossly his sensory exam is normal for the lower extremities.  Given that he has history of thoracic artery aneurysm, symptoms could be because of ruptured aneurysm or symptomatic thoracic artery aneurysm.  Additionally, peptic ulcer disease, perforated viscus are also in the differential.  CT dissection ordered.  The aneurysm appears to be stable, but there is a left-sided stone that is about 16 mm with hydronephrosis.  The CT dissection also reveals that he has essentially occluded right renal artery.  I discussed the case with Dr. Durwin Noraixon, vascular surgery.  He states that given that we have other explanation for the symptoms, he would recommend that patient get a follow-up with vascular surgery in 6 months.  I also spoke with Dr. Charmian Muffhigh on, urology.  He will admit the patient.  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  I have updated patient's wife and the patient.  Final Clinical Impressions(s) / ED Diagnoses   Final diagnoses:  Obstruction of left ureteropelvic junction (UPJ) due to stone  AAA (abdominal aortic aneurysm) without rupture [facility](HCC)    ED Discharge Orders    None       Derwood KaplanNanavati, Demita Tobia, MD 01/06/19 [phone]

## 2019-01-06 NOTE — ED Notes (Signed)
RN has allowed wife to be with patient while in ED due to patient presenting with some minor confusion. RN wants to ensure understanding of admission and surgical procedure.

## 2019-01-06 NOTE — ED Provider Notes (Signed)
8:22 AM-she will call from the urology fellow, who states that patient will need a stent later today, he prefers that patient be admitted to a hospital service.  Urology attending today is Dr. Tresa Moore.  Patient seen earlier by Dr. Kathrynn Humble, will arrange for testing including Covid-19 which is negative.  Patient was noted to have elevated creatinine.  No baseline creatinine in databank.  8:28 AM-Consult complete with hospitalist. Patient case explained and discussed.  She agrees to admit patient for further evaluation and treatment. Call ended at 10:15 AM     Daleen Bo, MD 01/06/19 1016

## 2019-01-06 NOTE — ED Notes (Signed)
Hospitalist at bedside 

## 2019-01-06 NOTE — H&P (Addendum)
History and Physical  Tanner LoraPrentice Loudon Jr. ZOX:[id number] DOB: 09-Jan-1932 DOA: 01/06/2019   Patient coming from: Home & is able to ambulate  Chief Complaint: Left flank pain  HPI: Tanner [name] Bankhead Jr. is a 85 y.o. male with medical history significant for hypertension, COPD, AAA, CAD, nephrolithiasis, psoriatic arthritis, GERD, presents to the ED complaining of abdominal pain located around the left flank region, radiating to the back for the past couple of days.  Denies any nausea/vomiting, diarrhea, dysuria, fever/chills, chest pain, shortness of breath.  ED Course: Patient afebrile, no leukocytosis, UA negative for infection, AKI, CT scan showed 16 mm stone at the left UPJ with mild left sided hydronephrosis.  Urology consulted, plan for stent placement this evening.  Patient admitted for further management.  Review of Systems: Review of systems are otherwise negative   Past Medical History:  Diagnosis Date  . Allergic rhinitis   . Anemia   . B12 deficiency   . BPH (benign prostatic hyperplasia)   . COPD (chronic obstructive pulmonary disease) (HCC)   . GERD (gastroesophageal reflux disease)   . H/O laryngeal cancer   . History of kidney stones   . Hx of sleep apnea    not on CPAP  . Hypercholesterolemia   . Hypertension   . PAD (peripheral artery disease) (HCC)   . Psoriatic arthritis (HCC)   . Pulmonary fibrosis (HCC)   . Vitamin D deficiency    Past Surgical History:  Procedure Laterality Date  . APPENDECTOMY    . Bilateral bicep tendon repair    . Extracopreal Shockwave Therapy for kidney stones    . HEMORRHOID SURGERY    . NECK SURGERY      Social History:  reports that he quit smoking about 15 years ago. His smoking use included cigarettes. He has a 150.00 pack-year smoking history. He has never used smokeless tobacco. He reports current alcohol use of about 12.0 standard drinks of alcohol per week. He reports that he does not use drugs.   Allergies  Allergen  Reactions  . Oxycodone-Acetaminophen Nausea And Vomiting  . Codeine Rash  . Sulfamethoxazole-Trimethoprim Nausea Only    Family History  Problem Relation Age of Onset  . Stroke Father       Prior to Admission medications   Medication Sig Start Date End Date Taking? Authorizing Provider  cetirizine (ZYRTEC) 10 MG tablet Take 10 mg by mouth at bedtime.   Yes [provider]  Cholecalciferol (VITAMIN D) 125 MCG (5000 UT) CAPS Take 5,000 Units by mouth daily.   Yes [provider]  Cyanocobalamin (VITAMIN B-12 IJ) Inject 1 mL as directed every 28 (twenty-eight) days.   Yes [provider]  docusate sodium (COLACE) 100 MG capsule Take 100 mg by mouth at bedtime as needed for mild constipation.   Yes [provider]  Guaifenesin (MUCINEX MAXIMUM STRENGTH) 1200 MG TB12 Take 1,200 mg by mouth daily.   Yes [provider]  losartan (COZAAR) 25 MG tablet Take 25 mg by mouth daily.   Yes [provider]  mycophenolate (CELLCEPT) 500 MG tablet Take 1,000 mg by mouth 2 (two) times daily.   Yes [provider]  pantoprazole (PROTONIX) 40 MG tablet Take 40 mg by mouth daily.   Yes [provider]  predniSONE (DELTASONE) 10 MG tablet Take 10 mg by mouth daily with breakfast.   Yes [provider]  Secukinumab, 300 MG Dose, (COSENTYX, 300 MG DOSE,) 150 MG/ML SOSY Inject 300 mg into the  skin every 28 (twenty-eight) days.   Yes [provider]  traMADol (ULTRAM) 50 MG tablet Take 50 mg by mouth every 12 (twelve) hours as needed for moderate pain.   Yes [provider]    Physical Exam: BP (!) 165/98 (BP Location: Right Arm)   Pulse 98   Temp 98 F (36.7 C) (Oral)   Resp 16   Ht 6' (1.829 m)   Wt 106.1 kg   SpO2 98%   BMI 31.72 kg/m   General: AAO x3 Eyes: Normal ENT: Normal Neck: Supple Cardiovascular: S1, S2 present Respiratory: CTAB Abdomen: Soft, left flank pain, nondistended, bowel sounds  present Skin: Normal Musculoskeletal: No pedal edema bilaterally Psychiatric: Normal mood Neurologic: No focal neurologic deficits noted          Labs on Admission:  Basic Metabolic Panel: Recent Labs  Lab 01/06/19 0220  NA 139  K 3.8  CL 104  CO2 25  GLUCOSE 146*  BUN 25*  CREATININE 1.74*  CALCIUM 9.8   Liver Function Tests: Recent Labs  Lab 01/06/19 0220  AST 23  ALT 23  ALKPHOS 43  BILITOT 0.6  PROT 8.1  ALBUMIN 4.4   Recent Labs  Lab 01/06/19 0220  LIPASE 26   No results for input(s): AMMONIA in the last 168 hours. CBC: Recent Labs  Lab 01/06/19 0220  WBC 8.7  HGB 13.1  HCT 43.7  MCV 101.6*  PLT 196   Cardiac Enzymes: No results for input(s): CKTOTAL, CKMB, CKMBINDEX, TROPONINI in the last 168 hours.  BNP (last 3 results) No results for input(s): BNP in the last 8760 hours.  ProBNP (last 3 results) No results for input(s): PROBNP in the last 8760 hours.  CBG: No results for input(s): GLUCAP in the last 168 hours.  Radiological Exams on Admission: Ct Angio Chest/abd/pel For Dissection W And/or W/wo  Result Date: 01/06/2019 CLINICAL DATA:  Severe abdominal pain and bloating for several hours. Personal history of abdominal aortic aneurysm. EXAM: CT ANGIOGRAPHY CHEST, ABDOMEN AND PELVIS TECHNIQUE: Multidetector CT imaging through the chest, abdomen and pelvis was performed using the standard protocol during bolus administration of intravenous contrast. Multiplanar reconstructed images and MIPs were obtained and reviewed to evaluate the vascular anatomy. CONTRAST:  80mL OMNIPAQUE IOHEXOL 350 MG/ML SOLN COMPARISON:  None. FINDINGS: CTA CHEST FINDINGS Cardiovascular: The heart size is normal. Dense atherosclerotic calcifications are present within the coronary arteries. Nondisplaced calcifications are present at the aortic arch and origins of the great vessels. There is no aneurysmal dilation of the aortic arch or significant stenosis of the great vessel  origins. Atherosclerotic irregularity is present within the descending thoracic aorta without aneurysmal dilation. Mediastinum/Nodes: Subcentimeter right paratracheal lymph nodes are present superiorly. No significant adenopathy is present. Thoracic inlet is within normal limits. Thyroid is atrophic. Esophagus is unremarkable. Lungs/Pleura: Paraseptal and centrilobular emphysematous changes are present. There is moderate dependent atelectasis. Peripheral fibrotic changes are noted at the bases. No mass lesion is present. There is no pneumothorax. Musculoskeletal: Vertebral body heights and alignment are maintained. No acute or healing fractures are present. Ribs are within normal limits. Sternum is unremarkable. Review of the MIP images confirms the above findings. CTA ABDOMEN AND PELVIS FINDINGS VASCULAR Aorta: Extensive atherosclerotic changes are present with mural plaque and calcifications. Transverse diameter of the aorta just above the superior mesenteric artery measures 4.2 cm. Lower abdominal aortic aneurysm measures 4.1 cm on the sagittal images. There is no dissection or occlusion. Celiac: Atherosclerotic calcifications are present at  the origin there is no significant stenosis of greater than 50%. Branch vessels are unremarkable. SMA: Atherosclerotic changes are present at the origin without significant stenosis. Distal branch vessels are unremarkable. Renals: Atherosclerotic calcifications are present at the origin of the renal arteries bilaterally. There is near occlusive stenosis on the right. A 60% stenosis is present on the left. Distal calcifications extend into the kidneys bilaterally. IMA: Patent without evidence of aneurysm, dissection, vasculitis or significant stenosis. Inflow: Extensive atherosclerotic calcifications are present in the iliac arteries bilaterally. There is borderline aneurysmal dilation of the proximal common iliac arteries measuring 15 mm on the right and 16 mm on the left. 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 A near occlusive stenosis is present in the right external iliac artery. Narrowing of the left external iliac artery is less than 50%. Veins: No obvious venous abnormality within the limitations of this arterial phase study. Review of the MIP images confirms the above findings. NON-VASCULAR Hepatobiliary: No focal liver abnormality is seen. No gallstones, gallbladder wall thickening, or biliary dilatation. Pancreas: Pancreas is diffusely atrophic. No mass lesion or cyst is present. There is no duct dilation. Spleen: Normal in size without focal abnormality. Adrenals/Urinary Tract: If adrenal glands are normal bilaterally. The right kidney is markedly atrophic. A punctate nonobstructing stone is present at the lower pole. There is mild dilation of the left renal collecting system. A 16 mm stone is present at the left UPJ. An additional punctate nonobstructing stone is present at the lower pole of the left kidney. The left ureter is otherwise normal. The urinary bladder is within normal limits. Stomach/Bowel: The stomach and duodenum are within normal limits. Small bowel is unremarkable. Terminal ileum is normal. The ascending and transverse colon are within normal limits. Descending colon is normal. Sigmoid colon extends into the left inguinal canal. It is otherwise within normal limits. There is no obstruction or inflammation. Lymphatic: No significant retroperitoneal adenopathy is present. Reproductive: Calcifications are present in the prostate. No mass lesion is present. There is no significant enlargement. Other: A loop of sigmoid colon herniates into the left inguinal canal without obstruction. Fat herniates into the right inguinal canal without associated bowel. Musculoskeletal: Vertebral body heights alignment are maintained. No focal lytic or blastic lesions are present. Bony pelvis is within normal limits. Hips are located and normal. Review of the MIP  images confirms the above findings. IMPRESSION: 1. No acute abnormality of the aorta. 2. Aortic Atherosclerosis (ICD10-I70.0). Diffuse calcifications and mural plaque present throughout the thoracic and abdominal aorta. 3. Abdominal aortic aneurysm measures 4.2 cm at the level of the superior mesenteric artery and 4.1 cm just above the bifurcation. 4. Or lying aneurysmal dilation of the common iliac arteries bilaterally. 5. 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 6. High-grade, near occlusive, stenosis of the right renal artery with associated right-sided renal atrophy. 7. At least 60% stenosis of the left renal artery. 8. High-grade stenosis of the right external iliac artery. 9.  Emphysema (ICD10-J43.9). 10. Dependent atelectasis and probable mild edema. 11. Extensive coronary artery disease. 12. 16 mm stone at the left UPJ with mild left-sided hydronephrosis. 13. Additional punctate nonobstructing stones at the lower pole of both kidneys. 14. Reactive type mediastinal lymph nodes. 15. Marked pancreatic atrophy. 16. Bilateral inguinal hernias. The left inguinal hernia contains a loop of sigmoid colon without obstruction. Electronically Signed   By: San Morelle M.D.   On: 01/06/2019 05:08    EKG:  Independently reviewed.  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, high-grade near occlusive stenosis of the right renal artery, high-grade stenosis of the right external iliac  artery EDP spoke to vascular surgeon, can follow-up in 6 months to monitor  Hypertension Stable Hold losartan due to AKI PRN hydralazine  COPD Stable Continue inhalers, duoneb  Psoriatic arthritis Stable Continue cellcept, prednisone  GERD Continue PPI  Alcohol abuse CIWA protocol     DVT prophylaxis: Heparin  Code Status: Full  Family Communication: None at bedside  Disposition Plan: To be determined  Consults called: Neurology  Admission status: Inpatient    Briant CedarNkeiruka J  MD [facility]  If 7PM-7AM, please contact night-coverage [email or web address]  01/06/2019, 3:39 PM

## 2019-01-06 NOTE — ED Triage Notes (Signed)
Pt complains of severe abdominal pain for 4 hours, he states he had a BM after dinner but says that his stomach feels like it's going to blow up

## 2019-01-06 NOTE — ED Notes (Signed)
ED TO INPATIENT HANDOFF REPORT  Name/Age/Gender Tanner Cox. 85 y.o. male  Code Status    Code Status Orders  (From admission, onward)         Start     Ordered   01/06/19 1047  Full code  Continuous     01/06/19 1046        Code Status History    This patient has a current code status but no historical code status.   Advance Care Planning Activity      Home/SNF/Other Home  Chief Complaint Abdominal Pain  Level of Care/Admitting Diagnosis ED Disposition    ED Disposition Condition Comment   Admit  Hospital Area: [facility] [[id number]]  Level of Care: Med-Surg [16]  Covid Evaluation: Confirmed COVID Negative  Diagnosis: Left nephrolithiasis [2025427]  Admitting Physician: Alma Friendly [[id number]]  Attending Physician: Alma Friendly [[id number]]  Estimated length of stay: past midnight tomorrow  Certification:: I certify this patient will need inpatient services for at least 2 midnights  PT Class (Do Not Modify): Inpatient [101]  PT Acc Code (Do Not Modify): Private [1]       Medical History Past Medical History:  Diagnosis Date  . Allergic rhinitis   . Anemia   . B12 deficiency   . BPH (benign prostatic hyperplasia)   . COPD (chronic obstructive pulmonary disease) ([place])   . GERD (gastroesophageal reflux disease)   . H/O laryngeal cancer   . History of kidney stones   . Hx of sleep apnea    not on CPAP  . Hypercholesterolemia   . Hypertension   . PAD (peripheral artery disease) ([place])   . Psoriatic arthritis ([place])   . Pulmonary fibrosis ([place])   . Vitamin D deficiency     Allergies Allergies  Allergen Reactions  . Oxycodone-Acetaminophen Nausea And Vomiting  . Codeine Rash  . Sulfamethoxazole-Trimethoprim Nausea Only    IV Location/Drains/Wounds Patient Lines/Drains/Airways Status   Active Line/Drains/Airways    Name:   Placement date:   Placement time:   Site:   Days:   Peripheral IV 01/06/19 Left Hand    01/06/19    0218    Hand   less than 1          Labs/Imaging Results for orders placed or performed during the hospital encounter of 01/06/19 (from the past 48 hour(s))  Lipase, blood     Status: None   Collection Time: 01/06/19  2:20 AM  Result Value Ref Range   Lipase 26 11 - 51 U/L    Comment: Performed at [facility], [place] [address], [place], [place] [address]  Comprehensive metabolic panel     Status: Abnormal   Collection Time: 01/06/19  2:20 AM  Result Value Ref Range   Sodium 139 135 - 145 mmol/L   Potassium 3.8 3.5 - 5.1 mmol/L   Chloride 104 98 - 111 mmol/L   CO2 25 22 - 32 mmol/L   Glucose, Bld 146 (H) 70 - 99 mg/dL   BUN 25 (H) 8 - 23 mg/dL   Creatinine, Ser 1.74 (H) 0.61 - 1.24 mg/dL   Calcium 9.8 8.9 - 10.3 mg/dL   Total Protein 8.1 6.5 - 8.1 g/dL   Albumin 4.4 3.5 - 5.0 g/dL   AST 23 15 - 41 U/L   ALT 23 0 - 44 U/L   Alkaline Phosphatase 43 38 - 126 U/L   Total Bilirubin 0.6 0.3 - 1.2 mg/dL  GFR calc non Af Amer 35 (L) >60 mL/min   GFR calc Af Amer 41 (L) >60 mL/min   Anion gap 10 5 - 15    Comment: Performed at [facility], [address] [place], [place] [address]  CBC     Status: Abnormal   Collection Time: 01/06/19  2:20 AM  Result Value Ref Range   WBC 8.7 4.0 - 10.5 K/uL   RBC 4.30 4.22 - 5.81 MIL/uL   Hemoglobin 13.1 13.0 - 17.0 g/dL   HCT 21.343.7 08.639.0 - 57.852.0 %   MCV 101.6 (H) 80.0 - 100.0 fL   MCH 30.5 26.0 - 34.0 pg   MCHC 30.0 30.0 - 36.0 g/dL   RDW 46.913.8 62.911.5 - 52.815.5 %   Platelets 196 150 - 400 K/uL   nRBC 0.0 0.0 - 0.2 %    Comment: Performed at [facility], [address] [place], [place] [address]  Type and screen     Status: None   Collection Time: 01/06/19  3:18 AM  Result Value Ref Range   ABO/RH(D) A POS    Antibody Screen NEG    Sample Expiration      01/09/2019,2359 Performed at [facility], [address] [place], [place] [address]   SARS  Coronavirus 2 (CEPHEID - Performed in [facility] hospital lab), Hosp Order     Status: None   Collection Time: 01/06/19  3:18 AM   Specimen: Nasopharyngeal Swab  Result Value Ref Range   SARS Coronavirus 2 NEGATIVE NEGATIVE    Comment: (NOTE) If result is NEGATIVE SARS-CoV-2 target nucleic acids are NOT DETECTED. The SARS-CoV-2 RNA is generally detectable in upper and lower  respiratory specimens during the acute phase of infection. The lowest  concentration of SARS-CoV-2 viral copies this assay can detect is 250  copies / mL. A negative result does not preclude SARS-CoV-2 infection  and should not be used as the sole basis for treatment or other  patient management decisions.  A negative result may occur with  improper specimen collection / handling, submission of specimen other  than nasopharyngeal swab, presence of viral mutation(s) within the  areas targeted by this assay, and inadequate number of viral copies  (<250 copies / mL). A negative result must be combined with clinical  observations, patient history, and epidemiological information. If result is POSITIVE SARS-CoV-2 target nucleic acids are DETECTED. The SARS-CoV-2 RNA is generally detectable in upper and lower  respiratory specimens dur ing the acute phase of infection.  Positive  results are indicative of active infection with SARS-CoV-2.  Clinical  correlation with patient history and other diagnostic information is  necessary to determine patient infection status.  Positive results do  not rule out bacterial infection or co-infection with other viruses. If result is PRESUMPTIVE POSTIVE SARS-CoV-2 nucleic acids MAY BE PRESENT.   A presumptive positive result was obtained on the submitted specimen  and confirmed on repeat testing.  While 2019 novel coronavirus  (SARS-CoV-2) nucleic acids may be present in the submitted sample  additional confirmatory testing may be necessary for epidemiological  and / or clinical  management purposes  to differentiate between  SARS-CoV-2 and other Sarbecovirus currently known to infect humans.  If clinically indicated additional testing with an alternate test  methodology [phone]([id number]) is advised. The SARS-CoV-2 RNA is generally  detectable in upper and lower respiratory sp ecimens during the acute  phase of infection. The expected result is Negative. Fact Sheet  for Patients:  BoilerBrush.com.cy Fact Sheet for Healthcare Providers: https://pope.com/ This test is not yet approved or cleared by the [place] FDA and has been authorized for detection and/or diagnosis of SARS-CoV-2 by FDA under an Emergency Use Authorization (EUA).  This EUA will remain in effect (meaning this test can be used) for the duration of the COVID-19 declaration under Section 564(b)(1) of the Act, 21 U.S.C. section 360bbb-3(b)(1), unless the authorization is terminated or revoked sooner. Performed at [facility], [address] [place], [place] [address]   ABO/Rh     Status: None   Collection Time: 01/06/19  3:20 AM  Result Value Ref Range   ABO/RH(D)      A POS Performed at [facility], [address] [place], [place] [address]   Urinalysis, Routine w reflex microscopic     Status: Abnormal   Collection Time: 01/06/19  5:05 AM  Result Value Ref Range   Color, Urine YELLOW YELLOW   APPearance CLEAR CLEAR   Specific Gravity, Urine 1.024 1.005 - 1.030   pH 5.0 5.0 - 8.0   Glucose, UA NEGATIVE NEGATIVE mg/dL   Hgb urine dipstick LARGE (A) NEGATIVE   Bilirubin Urine NEGATIVE NEGATIVE   Ketones, ur NEGATIVE NEGATIVE mg/dL   Protein, ur NEGATIVE NEGATIVE mg/dL   Nitrite NEGATIVE NEGATIVE   Leukocytes,Ua NEGATIVE NEGATIVE   RBC / HPF >50 (H) 0 - 5 RBC/hpf   WBC, UA 0-5 0 - 5 WBC/hpf   Bacteria, UA RARE (A) NONE SEEN   Mucus PRESENT     Comment: Performed at [facility], [address] [place], [place] [address]  Lactic acid, plasma     Status: None   Collection Time: 01/06/19  6:06 AM  Result Value Ref Range   Lactic Acid, Venous 1.5 0.5 - 1.9 mmol/L    Comment: Performed at [facility], [address], [place], [place] [address]   Ct Angio Chest/abd/pel For Dissection W And/or W/wo  Result Date: 01/06/2019 CLINICAL DATA:  Severe abdominal pain and bloating for several hours. Personal history of abdominal aortic aneurysm. EXAM: CT ANGIOGRAPHY CHEST, ABDOMEN AND PELVIS TECHNIQUE: Multidetector CT imaging through the chest, abdomen and pelvis was performed using the standard protocol during bolus administration of intravenous contrast. Multiplanar reconstructed images and MIPs were obtained and reviewed to evaluate the vascular anatomy. CONTRAST:  80mL OMNIPAQUE IOHEXOL 350 MG/ML SOLN COMPARISON:  None. FINDINGS: CTA CHEST FINDINGS Cardiovascular: The heart size is normal. Dense atherosclerotic calcifications are present within the coronary arteries. Nondisplaced calcifications are present at the aortic arch and origins of the great vessels. There is no aneurysmal dilation of the aortic arch or significant stenosis of the great vessel origins. Atherosclerotic irregularity is present within the descending thoracic aorta without aneurysmal dilation. Mediastinum/Nodes: Subcentimeter right paratracheal lymph nodes are present superiorly. No significant adenopathy is present. Thoracic inlet is within normal limits. Thyroid is atrophic. Esophagus is unremarkable. Lungs/Pleura: Paraseptal and centrilobular emphysematous changes are present. There is moderate dependent atelectasis. Peripheral fibrotic changes are noted at the bases. No mass lesion is present. There is no pneumothorax. Musculoskeletal: Vertebral body heights and alignment are maintained. No acute or healing fractures are present. Ribs are within normal limits. Sternum is unremarkable. Review of the  MIP images confirms the above findings. CTA ABDOMEN AND PELVIS FINDINGS VASCULAR Aorta: Extensive atherosclerotic changes are present with mural plaque and calcifications. Transverse diameter of the aorta just above the superior mesenteric artery  measures 4.2 cm. Lower abdominal aortic aneurysm measures 4.1 cm on the sagittal images. There is no dissection or occlusion. Celiac: Atherosclerotic calcifications are present at the origin there is no significant stenosis of greater than 50%. Branch vessels are unremarkable. SMA: Atherosclerotic changes are present at the origin without significant stenosis. Distal branch vessels are unremarkable. Renals: Atherosclerotic calcifications are present at the origin of the renal arteries bilaterally. There is near occlusive stenosis on the right. A 60% stenosis is present on the left. Distal calcifications extend into the kidneys bilaterally. IMA: Patent without evidence of aneurysm, dissection, vasculitis or significant stenosis. Inflow: Extensive atherosclerotic calcifications are present in the iliac arteries bilaterally. There is borderline aneurysmal dilation of the proximal common iliac arteries measuring 15 mm on the right and 16 mm on the left. 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 A near occlusive stenosis is present in the right external iliac artery. Narrowing of the left external iliac artery is less than 50%. Veins: No obvious venous abnormality within the limitations of this arterial phase study. Review of the MIP images confirms the above findings. NON-VASCULAR Hepatobiliary: No focal liver abnormality is seen. No gallstones, gallbladder wall thickening, or biliary dilatation. Pancreas: Pancreas is diffusely atrophic. No mass lesion or cyst is present. There is no duct dilation. Spleen: Normal in size without focal abnormality. Adrenals/Urinary Tract: If adrenal glands are normal bilaterally. The right kidney is markedly atrophic. A punctate  nonobstructing stone is present at the lower pole. There is mild dilation of the left renal collecting system. A 16 mm stone is present at the left UPJ. An additional punctate nonobstructing stone is present at the lower pole of the left kidney. The left ureter is otherwise normal. The urinary bladder is within normal limits. Stomach/Bowel: The stomach and duodenum are within normal limits. Small bowel is unremarkable. Terminal ileum is normal. The ascending and transverse colon are within normal limits. Descending colon is normal. Sigmoid colon extends into the left inguinal canal. It is otherwise within normal limits. There is no obstruction or inflammation. Lymphatic: No significant retroperitoneal adenopathy is present. Reproductive: Calcifications are present in the prostate. No mass lesion is present. There is no significant enlargement. Other: A loop of sigmoid colon herniates into the left inguinal canal without obstruction. Fat herniates into the right inguinal canal without associated bowel. Musculoskeletal: Vertebral body heights alignment are maintained. No focal lytic or blastic lesions are present. Bony pelvis is within normal limits. Hips are located and normal. Review of the MIP images confirms the above findings. IMPRESSION: 1. No acute abnormality of the aorta. 2. Aortic Atherosclerosis (ICD10-I70.0). Diffuse calcifications and mural plaque present throughout the thoracic and abdominal aorta. 3. Abdominal aortic aneurysm measures 4.2 cm at the level of the superior mesenteric artery and 4.1 cm just above the bifurcation. 4. Or lying aneurysmal dilation of the common iliac arteries bilaterally. 5. 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 6. High-grade, near occlusive, stenosis of the right renal artery with associated right-sided renal atrophy. 7. At least 60% stenosis of the left renal artery. 8. High-grade stenosis of the right external iliac artery. 9.  Emphysema  (ICD10-J43.9). 10. Dependent atelectasis and probable mild edema. 11. Extensive coronary artery disease. 12. 16 mm stone at the left UPJ with mild left-sided hydronephrosis. 13. Additional punctate nonobstructing stones at the lower pole of both kidneys. 14. Reactive type mediastinal lymph nodes. 15. Marked pancreatic atrophy. 16. Bilateral inguinal hernias. The left inguinal hernia  contains a loop of sigmoid colon without obstruction. Electronically Signed   By: Marin Robertshristopher  Mattern M.D.   On: 01/06/2019 05:08    Pending Labs Unresulted Labs (From admission, onward)    Start     Ordered   Signed and Armed forces training and education officerHeld  Basic metabolic panel  Tomorrow morning,   R     Signed and Held   Signed and Held  CBC  Tomorrow morning,   R     Signed and Held          Vitals/Pain Today's Vitals   01/06/19 0800 01/06/19 0900 01/06/19 1000 01/06/19 1035  BP: (!) 144/73 (!) 167/119 117/71 117/71  Pulse: 76 (!) 54 (!) 103 91  Resp: 19 18  15   SpO2: (S) 96% 97% 95% 96%  PainSc:        Isolation Precautions No active isolations  Medications Medications  sodium chloride flush (NS) 0.9 % injection 3 mL (3 mLs Intravenous Not Given 01/06/19 0218)  sodium chloride (PF) 0.9 % injection (0 mLs  Hold 01/06/19 1047)  acetaminophen (TYLENOL) tablet 650 mg (has no administration in time range)    Or  acetaminophen (TYLENOL) suppository 650 mg (has no administration in time range)  ondansetron (ZOFRAN) tablet 4 mg (has no administration in time range)    Or  ondansetron (ZOFRAN) injection 4 mg (has no administration in time range)  HYDROmorphone (DILAUDID) injection 1 mg (1 mg Intravenous Given 01/06/19 0326)  iohexol (OMNIPAQUE) 350 MG/ML injection 100 mL (80 mLs Intravenous Contrast Given 01/06/19 0348)  sodium chloride 0.9 % bolus 500 mL (0 mLs Intravenous Stopped 01/06/19 0730)    Mobility walks

## 2019-01-06 NOTE — ED Notes (Signed)
Pt refusing to keep oxygen on.

## 2019-01-06 NOTE — Transfer of Care (Signed)
Immediate Anesthesia Transfer of Care Note  Patient: Tanner Cox.  Procedure(s) Performed: CYSTOSCOPY WITH LEFT  RETROGRADE PYELOGRAM/URETERAL STENT PLACEMENT (Left )  Patient Location: PACU  Anesthesia Type:General  Level of Consciousness: awake and patient cooperative  Airway & Oxygen Therapy: Patient Spontanous Breathing and Patient connected to face mask oxygen  Post-op Assessment: Report given to RN and Post -op Vital signs reviewed and stable  Post vital signs: Reviewed and stable  Last Vitals:  Vitals Value Taken Time  BP    Temp 36.9 C 01/06/19 1945  Pulse 117 01/06/19 1947  Resp 16 01/06/19 1947  SpO2 96 % 01/06/19 1947  Vitals shown include unvalidated device data.  Last Pain:  Vitals:   01/06/19 1728  TempSrc: Oral  PainSc: 3       Patients Stated Pain Goal: 1 (78/67/67 2094)  Complications: No apparent anesthesia complications

## 2019-01-06 NOTE — Anesthesia Postprocedure Evaluation (Signed)
Anesthesia Post Note  Patient: Lochlin Eppinger.  Procedure(s) Performed: CYSTOSCOPY WITH LEFT  RETROGRADE PYELOGRAM/URETERAL STENT PLACEMENT (Left )     Patient location during evaluation: PACU Anesthesia Type: General Level of consciousness: awake and alert Pain management: pain level controlled Vital Signs Assessment: post-procedure vital signs reviewed and stable Respiratory status: spontaneous breathing, nonlabored ventilation, respiratory function stable and patient connected to nasal cannula oxygen Cardiovascular status: blood pressure returned to baseline and stable Postop Assessment: no apparent nausea or vomiting Anesthetic complications: no    Last Vitals:  Vitals:   01/06/19 2026 01/06/19 2030  BP: (!) 171/108 (!) 167/98  Pulse: (!) 116 (!) 113  Resp: 12 12  Temp:    SpO2: 94% 96%    Last Pain:  Vitals:   01/06/19 2015  TempSrc:   PainSc: 0-No pain                 Lidia Collum

## 2019-01-06 NOTE — ED Notes (Signed)
Patient transported to CT 

## 2019-01-07 ENCOUNTER — Encounter (HOSPITAL_COMMUNITY): Payer: Self-pay | Admitting: Urology

## 2019-01-07 DIAGNOSIS — N200 Calculus of kidney: Secondary | ICD-10-CM | POA: Diagnosis not present

## 2019-01-07 LAB — CBC
HCT: 37.5 % — ABNORMAL LOW (ref 39.0–52.0)
Hemoglobin: 11.1 g/dL — ABNORMAL LOW (ref 13.0–17.0)
MCH: 30.7 pg (ref 26.0–34.0)
MCHC: 29.6 g/dL — ABNORMAL LOW (ref 30.0–36.0)
MCV: 103.9 fL — ABNORMAL HIGH (ref 80.0–100.0)
Platelets: 170 10*3/uL (ref 150–400)
RBC: 3.61 MIL/uL — ABNORMAL LOW (ref 4.22–5.81)
RDW: 14.1 % (ref 11.5–15.5)
WBC: 4.7 10*3/uL (ref 4.0–10.5)
nRBC: 0 % (ref 0.0–0.2)

## 2019-01-07 LAB — BASIC METABOLIC PANEL
Anion gap: 12 (ref 5–15)
BUN: 27 mg/dL — ABNORMAL HIGH (ref 8–23)
CO2: 23 mmol/L (ref 22–32)
Calcium: 8.9 mg/dL (ref 8.9–10.3)
Chloride: 106 mmol/L (ref 98–111)
Creatinine, Ser: 1.91 mg/dL — ABNORMAL HIGH (ref 0.61–1.24)
GFR calc Af Amer: 36 mL/min — ABNORMAL LOW (ref >60)
GFR calc non Af Amer: 31 mL/min — ABNORMAL LOW (ref >60)
Glucose, Bld: 136 mg/dL — ABNORMAL HIGH (ref 70–99)
Potassium: 3.9 mmol/L (ref 3.5–5.1)
Sodium: 141 mmol/L (ref 135–145)

## 2019-01-07 MED ORDER — THIAMINE HCL 100 MG PO TABS: 100.0000 mg | ORAL_TABLET | Freq: Every day | ORAL | 0 refills | Status: AC

## 2019-01-07 MED ORDER — FOLIC ACID 1 MG PO TABS: 1.0000 mg | ORAL_TABLET | Freq: Every day | ORAL | 0 refills | Status: AC

## 2019-01-07 NOTE — Consult Note (Signed)
Urology Consult Note   Requesting Attending Physician:  Florencia Reasons, MD Service Providing Consult: Urology  Consulting Attending: Dr. Tresa Moore   Reason for Consult: Nephrolithiasis  HPI: Tanner Cox. is seen in consultation for reasons noted above at the request of Florencia Reasons, MD for evaluation of ureterolithiasis.  This is a 85 y.o. unhealthy male with past medical history of COPD, AAA, CAD, and nephrolithiasis who presents for left-sided flank pain.  CT scan shows 1 cm left UPJ stone and a atrophic right kidney.  He essentially has a solitary kidney.  No other concern for infection on urinalysis or labs.  Creatinine 1.7 from baseline 1.5 in care everywhere  Due to the essentially solitary kidney he meets criteria for decompression with stent  Interval 01/07/19: Underwent stent placement on 7/8. Sleeping comfortably this morning. Cherry red urine with condom cath. 300cc output documented overnight.   Cr increased today. 1.91 from 1.74  Past Medical History: COPD, AAA, CAD, and nephrolithiasis  Past Surgical History:  Multiple lithotripsy procedures locally in [place]  Objective   Vital signs in last 24 hours: BP (!) 156/95 (BP Location: Right Arm)   Pulse (!) 120   Temp 98.5 F (36.9 C) (Oral)   Resp 16   Ht 6' (1.829 m)   Wt 106.1 kg   SpO2 96%   BMI 31.72 kg/m   Physical Exam General: Disoriented, hard of hearing Pulmonary: Increased work of breathing Cardiovascular: HDS, adequate peripheral perfusion Abdomen: Tender in LLQ GU: No foley, Left CVA tenderness Extremities: warm and well perfused  Most Recent Labs: Lab Results  Component Value Date   WBC 4.7 01/07/2019   HGB 11.1 (L) 01/07/2019   HCT 37.5 (L) 01/07/2019   PLT 170 01/07/2019    Lab Results  Component Value Date   NA 141 01/07/2019   K 3.9 01/07/2019   CL 106 01/07/2019   CO2 23 01/07/2019   BUN 27 (H) 01/07/2019   CREATININE 1.91 (H) 01/07/2019   CALCIUM 8.9 01/07/2019     Assessment:  85 y.o. unhealthy male with functionally solitary kidney and nephrolithiasis.  Stented on 7/8  Recommendations: - Obtain PVR - Flomax for stent discomfort. Would hold on ditropan given age -He will need an additional stone surgery to remove the stone at a later date. Dr. Tresa Moore will request and our office will call the patient.    Thank you for this consult. Please contact the urology consult pager with any further questions/concerns.

## 2019-01-07 NOTE — Op Note (Signed)
NAME: Tanner Cox, [name] MEDICAL RECORD JG:[id number] ACCOUNT [id number] DATE OF BIRTH:11-22-1931 FACILITY: [facility] LOCATION: [facility] PHYSICIAN:Vinicio Lynk, MD  OPERATIVE REPORT  DATE OF PROCEDURE:  01/06/2019  PREOPERATIVE DIAGNOSIS:  Left ureteral stone and functionally solitary kidney.  PROCEDURE: 1.  Cystoscopy, left retrograde pyelogram creation. 2.  Insertion of left ureteral stent 5 x 26 Polaris, no tether.  ESTIMATED BLOOD LOSS:  Nil.  MEDICATIONS:  None.  SPECIMENS:  None.  FINDINGS: 1.  Left proximal ureteral stone with mild hydronephrosis. 2.  Placement of left ureteral stent proximal renal pelvis, distal end distal ureter.  Purposely floated given history of prior poor tolerance of stents.  INDICATIONS:  The patient is a quite comorbid 85 year old male with history of advanced dementia, also some pulmonary fibrosis.  He was found on workup of abdominal pain to have a left proximal ureteral stone.  His right kidney is essentially atrophic.   Given impending obstruction and functional solitary kidney, it is clearly felt that emergent decompression was warranted with stenting.  Informed consent was then placed in medical record after discussion with the patient's wife and the power of  attorney, Barnetta Chapel.    DESCRIPTION OF PROCEDURE:  The patient was identified.  Procedure being left ureteral stent placement confirmed.  Procedure timeout was performed.  Intravenous antibiotics administered.  General LMA anesthesia induced.  The patient was placed into a low  lithotomy position, sterile field was created by prepping and draping the patient's penis, perineum by using iodine.  Cystourethroscopy was performed using a 21-French rigid cystoscope with offset lens.  Inspection of anterior and posterior urethra were  unremarkable.  Inspection of bladder revealed mild trabeculation.  The left ureteral orifice was cannulated with a 30-French renal catheter and left retrograde  pyelogram was obtained.  Left retrograde pyelogram demonstrated a single left ureter single system left kidney.  There was a stone in the proximal most ureter with mild hydronephrosis above this.  A 0.038 sensor wire was advanced to lower pole, over which a new 5 x 26  Polaris-type stent was placed using cystoscopic and fluoroscopic guidance.  The stent was purposely floated with the distal end being in the distal ureter but clearly traversing the area of obstruction given history of poor tolerance of prior stents and  plan for a staged approach with ureteroscopy likely in several weeks.  The bladder was entered per cystoscope.  Procedure terminated.  The patient tolerated the procedure well.  No immediate perioperative complications.  The patient was taken to  postanesthesia care in stable condition.  TN/NUANCE  D:01/06/2019 T:01/07/2019 JOB:[id number]

## 2019-01-07 NOTE — Care Management CC44 (Signed)
Condition Code 44 Documentation Completed  Patient Details  Name: Tanner Cox. MRN: [id number] Date of Birth: 1932/06/30   Condition Code 44 given:  Yes Patient signature on Condition Code 44 notice:  Yes Documentation of 2 MD's agreement:  Yes Code 44 added to claim:  Yes    Dessa Phi, RN 01/07/2019, 11:02 AM

## 2019-01-07 NOTE — Care Management Obs Status (Signed)
[facility] NOTIFICATION   Patient Details  Name: Tanner Cox. MRN: [id number] Date of Birth: [birth year]   Medicare Observation Status Notification Given:  Yes    Dessa Phi, RN 01/07/2019, 11:02 AM

## 2019-01-07 NOTE — Plan of Care (Signed)

## 2019-01-07 NOTE — TOC Transition Note (Signed)
Transition of Care [facility]) - CM/SW Discharge Note   Patient Details  Name: Tanner Cox. MRN: [id number] Date of Birth: September 24, 1931  Transition of Care [facility]) CM/SW Contact:  Dessa Phi, RN Phone Number: 01/07/2019, 11:09 AM   Clinical Narrative:Spoke to spouse in rm about d/c plans-she plans to take patient to his daughter's home for a short stay to help with his confusion [place]. Has pcp,pahramcy, transportation. No CM needs.       Final next level of care: Home/Self Care Barriers to Discharge: No Barriers Identified   Patient Goals and CMS Choice        Discharge Placement                       Discharge Plan and Services   Discharge Planning Services: CM Consult                                 Social Determinants of Health (SDOH) Interventions     Readmission Risk Interventions No flowsheet data found.

## 2019-01-07 NOTE — Discharge Summary (Signed)
Discharge Summary  Tanner Cox. ZOX:[id number] DOB: Nov 07, 1931  PCP: System, Pcp Not In  Admit date: 01/06/2019 Discharge date: 01/07/2019  Time spent: 45mins, more than 50% time spent on coordination of care. Case discussed with urology Dr Berneice HeinrichManny Plan of care discussed with wife at bedside  Recommendations for Outpatient Follow-up:  1. F/u with PCP within a week  for hospital discharge follow up, repeat cbc/bmp at follow up. pcp to refer patient to vascular surgery for renal artery stenosis and right external iliac artery stenosis,  neurology for chronic hallucination 2. F/u with urology Dr Berneice HeinrichManny for nephrolithiasis 3. F/u with vascular surgery for renal artery stenosis, right external iliac artery stenosis  Discharge Diagnoses:  Active Hospital Problems   Diagnosis Date Noted  . Left nephrolithiasis 01/06/2019    Resolved Hospital Problems  No resolved problems to display.    Discharge Condition: stable  Diet recommendation: heart healthy  Filed Weights   01/06/19 1151  Weight: 106.1 kg    History of present illness: (per admitting MD Dr Sharolyn DouglasEzenduka) Patient coming from: Home & is able to ambulate  Chief Complaint: Left flank pain  HPI: Tanner Cox. is a 85 y.o. male with medical history significant for hypertension, COPD, AAA, CAD, nephrolithiasis, psoriatic arthritis, GERD, presents to the ED complaining of abdominal pain located around the left flank region, radiating to the back for the past couple of days.  Denies any nausea/vomiting, diarrhea, dysuria, fever/chills, chest pain, shortness of breath.  ED Course: Patient afebrile, no leukocytosis, UA negative for infection, AKI, CT scan showed 16 mm stone at the left UPJ with mild left sided hydronephrosis.  Urology consulted, plan for stent placement this evening.  Patient admitted for further management.  Hospital Course:  Active Problems:   Left nephrolithiasis  Left proximal ureteral stone with mild  hydronephrosis Afebrile, no leukocytosis UA negative for infection LA 1.5 CT scan showed 16 mm stone at the left UPJ with mild left sided hydronephrosis, Atrophic right kidney Urology consulted, S/p Placement of left ureteral stent proximal renal pelvis, distal end distal ureter.  Purposely floated given history of prior poor tolerance of stents. plan for a staged approach with ureteroscopy likely in several weeks.   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, high-grade near occlusive stenosis of the right renal artery, high-grade stenosis of the right external iliac artery EDP spoke to vascular surgeon, can follow-up in 6 months to monitor  Hypertension Stable losartan held due to AKI, resumed at discharge F/u with pcp  COPD, not home o2 dependent Stable, no cough, no wheezing, no hypoxia Continue inhalers, duoneb  Psoriatic arthritis/pulmonary fibrosis Stable On secukinumab , cellcept, prednisone  GERD Continue PPI  Alcohol abuse No significant withdrawal symptom in the hospital  H/o macular degeneration, Chronic visual hallucination, per wife Advise to follow up with ophthalmology and neurology    Procedures:  S/p Placement of left ureteral stent proximal renal pelvis, distal end distal ureter on 7/8  Consultations:  urology  Discharge Exam: BP (!) 156/95 (BP Location: Right Arm)   Pulse (!) 120   Temp 98.5 F (36.9 C) (Oral)   Resp 16   Ht 6' (1.829  m)   Wt 106.1 kg   SpO2 96%   BMI 31.72 kg/m   General: NAD Cardiovascular: RRR Respiratory: CTABL  Discharge Instructions You were cared for by a hospitalist during your hospital stay. If you have any  questions about your discharge medications or the care you received while you were in the hospital after you are discharged, you can call the unit and asked to speak with the hospitalist on call if the hospitalist that took care of you is not available. Once you are discharged, your primary care physician will handle any further medical issues. Please note that NO REFILLS for any discharge medications will be authorized once you are discharged, as it is imperative that you return to your primary care physician (or establish a relationship with a primary care physician if you do not have one) for your aftercare needs so that they can reassess your need for medications and monitor your lab values.  Discharge Instructions    Diet - low sodium heart healthy   Complete by: As directed    Increase activity slowly   Complete by: As directed      Allergies as of 01/07/2019      Reactions   Oxycodone-acetaminophen Nausea And Vomiting   Codeine Rash   Sulfamethoxazole-trimethoprim Nausea Only      Medication List    TAKE these medications   cetirizine 10 MG tablet Commonly known as: ZYRTEC Take 10 mg by mouth at bedtime.   Cosentyx (300 MG Dose) 150 MG/ML Sosy Generic drug: Secukinumab (300 MG Dose) Inject 300 mg into the skin every 28 (twenty-eight) days.   docusate sodium 100 MG capsule Commonly known as: COLACE Take 100 mg by mouth at bedtime as needed for mild constipation.   folic acid 1 MG tablet Commonly known as: FOLVITE Take 1 tablet (1 mg total) by mouth daily.   levalbuterol 1.25 MG/0.5ML nebulizer solution Commonly known as: XOPENEX Take 1.25 mg by nebulization daily as needed (pulmonary).   losartan 25 MG tablet Commonly known as: COZAAR Take 25 mg by mouth daily.   Mucinex Maximum Strength 1200 MG Tb12 Generic drug: Guaifenesin Take 1,200 mg by mouth daily.   mycophenolate 500 MG tablet Commonly known as: CELLCEPT Take 1,000 mg by mouth 2 (two) times daily.    pantoprazole 40 MG tablet Commonly known as: PROTONIX Take 40 mg by mouth daily.   predniSONE 10 MG tablet Commonly known as: DELTASONE Take 10 mg by mouth daily with breakfast.   thiamine 100 MG tablet Take 1 tablet (100 mg total) by mouth daily.   traMADol 50 MG tablet Commonly known as: ULTRAM Take 50 mg by mouth every 12 (twelve) hours as needed for moderate pain.   VITAMIN B-12 IJ Inject 1 mL as directed every 28 (twenty-eight) days.   Vitamin D 125 MCG (5000 UT) Caps Take 5,000 Units by mouth daily.      Allergies  Allergen Reactions  . Oxycodone-Acetaminophen Nausea And Vomiting  . Codeine Rash  . Sulfamethoxazole-Trimethoprim Nausea Only   Follow-up Information    Sebastian Ache, MD Follow up.   Specialty: Urology Contact information: [address] [place] [address] [phone]        f/u wiht pcp for hospital discharge follow up. Follow up.   Why: pcp to refer to neurology for  chronic hallucination management pcp to refer to vascular surgery for renal artery stenosis and right external iliac artery stenosis        Waverly Ferrari  S, MD Follow up.   Specialties: Vascular Surgery, Cardiology Why: renal artery stenosis , right external iliac artery stenosis  Contact information: [place] [address] [phone]            The results of significant diagnostics from this hospitalization (including imaging, microbiology, ancillary and laboratory) are listed below for reference.    Significant Diagnostic Studies: Dg C-arm 1-60 Min-no Report  Result Date: 01/06/2019 Fluoroscopy was utilized by the requesting physician.  No radiographic interpretation.   Ct Angio Chest/abd/pel For Dissection W And/or W/wo  Result Date: 01/06/2019 CLINICAL DATA:  Severe abdominal pain and bloating for several hours. Personal history of abdominal aortic aneurysm. EXAM: CT ANGIOGRAPHY CHEST, ABDOMEN AND PELVIS TECHNIQUE: Multidetector CT  imaging through the chest, abdomen and pelvis was performed using the standard protocol during bolus administration of intravenous contrast. Multiplanar reconstructed images and MIPs were obtained and reviewed to evaluate the vascular anatomy. CONTRAST:  66mL OMNIPAQUE IOHEXOL 350 MG/ML SOLN COMPARISON:  None. FINDINGS: CTA CHEST FINDINGS Cardiovascular: The heart size is normal. Dense atherosclerotic calcifications are present within the coronary arteries. Nondisplaced calcifications are present at the aortic arch and origins of the great vessels. There is no aneurysmal dilation of the aortic arch or significant stenosis of the great vessel origins. Atherosclerotic irregularity is present within the descending thoracic aorta without aneurysmal dilation. Mediastinum/Nodes: Subcentimeter right paratracheal lymph nodes are present superiorly. No significant adenopathy is present. Thoracic inlet is within normal limits. Thyroid is atrophic. Esophagus is unremarkable. Lungs/Pleura: Paraseptal and centrilobular emphysematous changes are present. There is moderate dependent atelectasis. Peripheral fibrotic changes are noted at the bases. No mass lesion is present. There is no pneumothorax. Musculoskeletal: Vertebral body heights and alignment are maintained. No acute or healing fractures are present. Ribs are within normal limits. Sternum is unremarkable. Review of the MIP images confirms the above findings. CTA ABDOMEN AND PELVIS FINDINGS VASCULAR Aorta: Extensive atherosclerotic changes are present with mural plaque and calcifications. Transverse diameter of the aorta just above the superior mesenteric artery measures 4.2 cm. Lower abdominal aortic aneurysm measures 4.1 cm on the sagittal images. There is no dissection or occlusion. Celiac: Atherosclerotic calcifications are present at the origin there is no significant stenosis of greater than 50%. Branch vessels are unremarkable. SMA: Atherosclerotic changes are  present at the origin without significant stenosis. Distal branch vessels are unremarkable. Renals: Atherosclerotic calcifications are present at the origin of the renal arteries bilaterally. There is near occlusive stenosis on the right. A 60% stenosis is present on the left. Distal calcifications extend into the kidneys bilaterally. IMA: Patent without evidence of aneurysm, dissection, vasculitis or significant stenosis. Inflow: Extensive atherosclerotic calcifications are present in the iliac arteries bilaterally. There is borderline aneurysmal dilation of the proximal common iliac arteries measuring 15 mm on the right and 16 mm on the left. 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 A near occlusive stenosis is present in the right external iliac artery. Narrowing of the left external iliac artery is less than 50%. Veins: No obvious venous abnormality within the limitations of this arterial phase study. Review of the MIP images confirms the above findings. NON-VASCULAR Hepatobiliary: No focal liver abnormality is seen. No gallstones, gallbladder wall thickening, or biliary dilatation. Pancreas: Pancreas is diffusely atrophic. No mass lesion or cyst is present. There is no duct dilation. Spleen: Normal in size without focal abnormality. Adrenals/Urinary Tract: If adrenal glands are normal bilaterally. The right kidney is markedly atrophic. A punctate nonobstructing  stone is present at the lower pole. There is mild dilation of the left renal collecting system. A 16 mm stone is present at the left UPJ. An additional punctate nonobstructing stone is present at the lower pole of the left kidney. The left ureter is otherwise normal. The urinary bladder is within normal limits. Stomach/Bowel: The stomach and duodenum are within normal limits. Small bowel is unremarkable. Terminal ileum is normal. The ascending and transverse colon are within normal limits. Descending colon is normal. Sigmoid colon extends  into the left inguinal canal. It is otherwise within normal limits. There is no obstruction or inflammation. Lymphatic: No significant retroperitoneal adenopathy is present. Reproductive: Calcifications are present in the prostate. No mass lesion is present. There is no significant enlargement. Other: A loop of sigmoid colon herniates into the left inguinal canal without obstruction. Fat herniates into the right inguinal canal without associated bowel. Musculoskeletal: Vertebral body heights alignment are maintained. No focal lytic or blastic lesions are present. Bony pelvis is within normal limits. Hips are located and normal. Review of the MIP images confirms the above findings. IMPRESSION: 1. No acute abnormality of the aorta. 2. Aortic Atherosclerosis (ICD10-I70.0). Diffuse calcifications and mural plaque present throughout the thoracic and abdominal aorta. 3. Abdominal aortic aneurysm measures 4.2 cm at the level of the superior mesenteric artery and 4.1 cm just above the bifurcation. 4. Or lying aneurysmal dilation of the common iliac arteries bilaterally. 5. 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 6. High-grade, near occlusive, stenosis of the right renal artery with associated right-sided renal atrophy. 7. At least 60% stenosis of the left renal artery. 8. High-grade stenosis of the right external iliac artery. 9.  Emphysema (ICD10-J43.9). 10. Dependent atelectasis and probable mild edema. 11. Extensive coronary artery disease. 12. 16 mm stone at the left UPJ with mild left-sided hydronephrosis. 13. Additional punctate nonobstructing stones at the lower pole of both kidneys. 14. Reactive type mediastinal lymph nodes. 15. Marked pancreatic atrophy. 16. Bilateral inguinal hernias. The left inguinal hernia contains a loop of sigmoid colon without obstruction. Electronically Signed   By: Marin Robertshristopher  Mattern M.D.   On: 01/06/2019 05:08    Microbiology: Recent Results (from the  past 240 hour(s))  SARS Coronavirus 2 (CEPHEID - Performed in [facility] lab), Hosp Order     Status: None   Collection Time: 01/06/19  3:18 AM   Specimen: Nasopharyngeal Swab  Result Value Ref Range Status   SARS Coronavirus 2 NEGATIVE NEGATIVE Final    Comment: (NOTE) If result is NEGATIVE SARS-CoV-2 target nucleic acids are NOT DETECTED. The SARS-CoV-2 RNA is generally detectable in upper and lower  respiratory specimens during the acute phase of infection. The lowest  concentration of SARS-CoV-2 viral copies this assay can detect is 250  copies / mL. A negative result does not preclude SARS-CoV-2 infection  and should not be used as the sole basis for treatment or other  patient management decisions.  A negative result may occur with  improper specimen collection / handling, submission of specimen other  than nasopharyngeal swab, presence of viral mutation(s) within the  areas targeted by this assay, and inadequate number of viral copies  (<250 copies / mL). A negative result must be combined with clinical  observations, patient history, and epidemiological information. If result is POSITIVE SARS-CoV-2 target nucleic acids are DETECTED. The SARS-CoV-2 RNA is generally detectable in upper and lower  respiratory specimens dur ing the acute phase of infection.  Positive  results are indicative of active infection with SARS-CoV-2.  Clinical  correlation with patient history and other diagnostic information is  necessary to determine patient infection status.  Positive results do  not rule out bacterial infection or co-infection with other viruses. If result is PRESUMPTIVE POSTIVE SARS-CoV-2 nucleic acids MAY BE PRESENT.   A presumptive positive result was obtained on the submitted specimen  and confirmed on repeat testing.  While 2019 novel coronavirus  (SARS-CoV-2) nucleic acids may be present in the submitted sample  additional confirmatory testing may be necessary for  epidemiological  and / or clinical management purposes  to differentiate between  SARS-CoV-2 and other Sarbecovirus currently known to infect humans.  If clinically indicated additional testing with an alternate test  methodology [phone] is advised. The SARS-CoV-2 RNA is generally  detectable in upper and lower respiratory sp ecimens during the acute  phase of infection. The expected result is Negative. Fact Sheet for Patients:  BoilerBrush.com.cyhttps://www.fda.gov/media/136312/download Fact Sheet for Healthcare Providers: https://pope.com/https://www.fda.gov/media/136313/download This test is not yet approved or cleared by the [place] FDA and has been authorized for detection and/or diagnosis of SARS-CoV-2 by FDA under an Emergency Use Authorization (EUA).  This EUA will remain in effect (meaning this test can be used) for the duration of the COVID-19 declaration under Section 564(b)(1) of the Act, 21 U.S.C. section 360bbb-3(b)(1), unless the authorization is terminated or revoked sooner. Performed at [facility], [address] [place], [place] [address]   MRSA PCR Screening     Status: None   Collection Time: 01/06/19  1:44 PM   Specimen: Nasal Mucosa; Nasopharyngeal  Result Value Ref Range Status   MRSA by PCR NEGATIVE NEGATIVE Final    Comment:        The GeneXpert MRSA Assay (FDA approved for NASAL specimens only), is one component of a comprehensive MRSA colonization surveillance program. It is not intended to diagnose MRSA infection nor to guide or monitor treatment for MRSA infections. Performed at [facility], [address] [place], [place] [address]      Labs: Basic Metabolic Panel: Recent Labs  Lab 01/06/19 0220 01/07/19 0510  NA 139 141  K 3.8 3.9  CL 104 106  CO2 25 23  GLUCOSE 146* 136*  BUN 25* 27*  CREATININE 1.74* 1.91*  CALCIUM 9.8 8.9   Liver Function Tests: Recent Labs  Lab 01/06/19 0220  AST 23  ALT 23  ALKPHOS 43   BILITOT 0.6  PROT 8.1  ALBUMIN 4.4   Recent Labs  Lab 01/06/19 0220  LIPASE 26   No results for input(s): AMMONIA in the last 168 hours. CBC: Recent Labs  Lab 01/06/19 0220 01/07/19 0510  WBC 8.7 4.7  HGB 13.1 11.1*  HCT 43.7 37.5*  MCV 101.6* 103.9*  PLT 196 170   Cardiac Enzymes: No results for input(s): CKTOTAL, CKMB, CKMBINDEX, TROPONINI in the last 168 hours. BNP: BNP (last 3 results) No results for input(s): BNP in the last 8760 hours.  ProBNP (last 3 results) No results for input(s): PROBNP in the last 8760 hours.  CBG: No results for input(s): GLUCAP in the last 168 hours.     Signed:  Albertine GratesFang Keng Jewel MD, PhD  [facility] 01/07/2019, 8:45 PM

## 2019-01-08 ENCOUNTER — Inpatient Hospital Stay (HOSPITAL_COMMUNITY)
Admission: EM | Admit: 2019-01-08 | Discharge: 2019-01-30 | DRG: 987 | Disposition: E | Payer: Medicare Other | Attending: Internal Medicine | Admitting: Internal Medicine

## 2019-01-08 ENCOUNTER — Emergency Department (HOSPITAL_COMMUNITY): Payer: Medicare Other

## 2019-01-08 ENCOUNTER — Encounter (HOSPITAL_COMMUNITY): Payer: Self-pay

## 2019-01-08 ENCOUNTER — Other Ambulatory Visit: Payer: Self-pay

## 2019-01-08 DIAGNOSIS — I472 Ventricular tachycardia: Secondary | ICD-10-CM | POA: Diagnosis not present

## 2019-01-08 DIAGNOSIS — Z7189 Other specified counseling: Secondary | ICD-10-CM | POA: Diagnosis not present

## 2019-01-08 DIAGNOSIS — Z452 Encounter for adjustment and management of vascular access device: Secondary | ICD-10-CM

## 2019-01-08 DIAGNOSIS — N179 Acute kidney failure, unspecified: Secondary | ICD-10-CM | POA: Diagnosis not present

## 2019-01-08 DIAGNOSIS — E46 Unspecified protein-calorie malnutrition: Secondary | ICD-10-CM | POA: Diagnosis not present

## 2019-01-08 DIAGNOSIS — I471 Supraventricular tachycardia: Secondary | ICD-10-CM | POA: Diagnosis not present

## 2019-01-08 DIAGNOSIS — N135 Crossing vessel and stricture of ureter without hydronephrosis: Secondary | ICD-10-CM | POA: Diagnosis present

## 2019-01-08 DIAGNOSIS — L4050 Arthropathic psoriasis, unspecified: Secondary | ICD-10-CM | POA: Diagnosis present

## 2019-01-08 DIAGNOSIS — I708 Atherosclerosis of other arteries: Secondary | ICD-10-CM | POA: Diagnosis present

## 2019-01-08 DIAGNOSIS — Z79891 Long term (current) use of opiate analgesic: Secondary | ICD-10-CM

## 2019-01-08 DIAGNOSIS — I10 Essential (primary) hypertension: Secondary | ICD-10-CM

## 2019-01-08 DIAGNOSIS — J9621 Acute and chronic respiratory failure with hypoxia: Secondary | ICD-10-CM | POA: Diagnosis not present

## 2019-01-08 DIAGNOSIS — Z87891 Personal history of nicotine dependence: Secondary | ICD-10-CM

## 2019-01-08 DIAGNOSIS — I714 Abdominal aortic aneurysm, without rupture: Secondary | ICD-10-CM | POA: Diagnosis present

## 2019-01-08 DIAGNOSIS — F1020 Alcohol dependence, uncomplicated: Secondary | ICD-10-CM | POA: Diagnosis present

## 2019-01-08 DIAGNOSIS — Z9981 Dependence on supplemental oxygen: Secondary | ICD-10-CM

## 2019-01-08 DIAGNOSIS — J9600 Acute respiratory failure, unspecified whether with hypoxia or hypercapnia: Secondary | ICD-10-CM

## 2019-01-08 DIAGNOSIS — E878 Other disorders of electrolyte and fluid balance, not elsewhere classified: Secondary | ICD-10-CM | POA: Diagnosis not present

## 2019-01-08 DIAGNOSIS — J441 Chronic obstructive pulmonary disease with (acute) exacerbation: Secondary | ICD-10-CM | POA: Diagnosis not present

## 2019-01-08 DIAGNOSIS — Z20828 Contact with and (suspected) exposure to other viral communicable diseases: Secondary | ICD-10-CM | POA: Diagnosis present

## 2019-01-08 DIAGNOSIS — Z4659 Encounter for fitting and adjustment of other gastrointestinal appliance and device: Secondary | ICD-10-CM

## 2019-01-08 DIAGNOSIS — R0902 Hypoxemia: Secondary | ICD-10-CM

## 2019-01-08 DIAGNOSIS — Z79899 Other long term (current) drug therapy: Secondary | ICD-10-CM

## 2019-01-08 DIAGNOSIS — J3800 Paralysis of vocal cords and larynx, unspecified: Secondary | ICD-10-CM | POA: Diagnosis present

## 2019-01-08 DIAGNOSIS — J181 Lobar pneumonia, unspecified organism: Secondary | ICD-10-CM | POA: Diagnosis not present

## 2019-01-08 DIAGNOSIS — Z87442 Personal history of urinary calculi: Secondary | ICD-10-CM

## 2019-01-08 DIAGNOSIS — J432 Centrilobular emphysema: Secondary | ICD-10-CM | POA: Diagnosis not present

## 2019-01-08 DIAGNOSIS — I169 Hypertensive crisis, unspecified: Secondary | ICD-10-CM | POA: Diagnosis not present

## 2019-01-08 DIAGNOSIS — I517 Cardiomegaly: Secondary | ICD-10-CM | POA: Diagnosis not present

## 2019-01-08 DIAGNOSIS — F0390 Unspecified dementia without behavioral disturbance: Secondary | ICD-10-CM | POA: Diagnosis present

## 2019-01-08 DIAGNOSIS — F05 Delirium due to known physiological condition: Secondary | ICD-10-CM | POA: Diagnosis not present

## 2019-01-08 DIAGNOSIS — J8410 Pulmonary fibrosis, unspecified: Secondary | ICD-10-CM | POA: Diagnosis not present

## 2019-01-08 DIAGNOSIS — N132 Hydronephrosis with renal and ureteral calculous obstruction: Secondary | ICD-10-CM | POA: Diagnosis present

## 2019-01-08 DIAGNOSIS — Z888 Allergy status to other drugs, medicaments and biological substances status: Secondary | ICD-10-CM

## 2019-01-08 DIAGNOSIS — J9601 Acute respiratory failure with hypoxia: Secondary | ICD-10-CM | POA: Diagnosis not present

## 2019-01-08 DIAGNOSIS — R410 Disorientation, unspecified: Secondary | ICD-10-CM | POA: Diagnosis not present

## 2019-01-08 DIAGNOSIS — J189 Pneumonia, unspecified organism: Secondary | ICD-10-CM | POA: Diagnosis not present

## 2019-01-08 DIAGNOSIS — E876 Hypokalemia: Secondary | ICD-10-CM | POA: Diagnosis not present

## 2019-01-08 DIAGNOSIS — E870 Hyperosmolality and hypernatremia: Secondary | ICD-10-CM | POA: Diagnosis not present

## 2019-01-08 DIAGNOSIS — J849 Interstitial pulmonary disease, unspecified: Secondary | ICD-10-CM

## 2019-01-08 DIAGNOSIS — J449 Chronic obstructive pulmonary disease, unspecified: Secondary | ICD-10-CM

## 2019-01-08 DIAGNOSIS — E7800 Pure hypercholesterolemia, unspecified: Secondary | ICD-10-CM | POA: Diagnosis present

## 2019-01-08 DIAGNOSIS — I701 Atherosclerosis of renal artery: Secondary | ICD-10-CM | POA: Diagnosis present

## 2019-01-08 DIAGNOSIS — R042 Hemoptysis: Secondary | ICD-10-CM

## 2019-01-08 DIAGNOSIS — Z7952 Long term (current) use of systemic steroids: Secondary | ICD-10-CM

## 2019-01-08 DIAGNOSIS — N183 Chronic kidney disease, stage 3 (moderate): Secondary | ICD-10-CM | POA: Diagnosis present

## 2019-01-08 DIAGNOSIS — Z823 Family history of stroke: Secondary | ICD-10-CM

## 2019-01-08 DIAGNOSIS — D631 Anemia in chronic kidney disease: Secondary | ICD-10-CM | POA: Diagnosis present

## 2019-01-08 DIAGNOSIS — R001 Bradycardia, unspecified: Secondary | ICD-10-CM | POA: Diagnosis not present

## 2019-01-08 DIAGNOSIS — I2510 Atherosclerotic heart disease of native coronary artery without angina pectoris: Secondary | ICD-10-CM | POA: Diagnosis present

## 2019-01-08 DIAGNOSIS — I480 Paroxysmal atrial fibrillation: Secondary | ICD-10-CM | POA: Diagnosis present

## 2019-01-08 DIAGNOSIS — R0603 Acute respiratory distress: Secondary | ICD-10-CM

## 2019-01-08 DIAGNOSIS — Z66 Do not resuscitate: Secondary | ICD-10-CM | POA: Diagnosis not present

## 2019-01-08 DIAGNOSIS — R296 Repeated falls: Secondary | ICD-10-CM | POA: Diagnosis present

## 2019-01-08 DIAGNOSIS — N400 Enlarged prostate without lower urinary tract symptoms: Secondary | ICD-10-CM | POA: Diagnosis present

## 2019-01-08 DIAGNOSIS — I952 Hypotension due to drugs: Secondary | ICD-10-CM | POA: Diagnosis not present

## 2019-01-08 DIAGNOSIS — R1310 Dysphagia, unspecified: Secondary | ICD-10-CM | POA: Diagnosis present

## 2019-01-08 DIAGNOSIS — Z515 Encounter for palliative care: Secondary | ICD-10-CM | POA: Diagnosis not present

## 2019-01-08 DIAGNOSIS — K219 Gastro-esophageal reflux disease without esophagitis: Secondary | ICD-10-CM | POA: Diagnosis present

## 2019-01-08 DIAGNOSIS — M1990 Unspecified osteoarthritis, unspecified site: Secondary | ICD-10-CM | POA: Diagnosis present

## 2019-01-08 DIAGNOSIS — J690 Pneumonitis due to inhalation of food and vomit: Principal | ICD-10-CM | POA: Diagnosis present

## 2019-01-08 DIAGNOSIS — Z881 Allergy status to other antibiotic agents status: Secondary | ICD-10-CM

## 2019-01-08 DIAGNOSIS — J84112 Idiopathic pulmonary fibrosis: Secondary | ICD-10-CM | POA: Diagnosis not present

## 2019-01-08 DIAGNOSIS — R0602 Shortness of breath: Secondary | ICD-10-CM | POA: Diagnosis present

## 2019-01-08 DIAGNOSIS — Z8521 Personal history of malignant neoplasm of larynx: Secondary | ICD-10-CM

## 2019-01-08 DIAGNOSIS — I129 Hypertensive chronic kidney disease with stage 1 through stage 4 chronic kidney disease, or unspecified chronic kidney disease: Secondary | ICD-10-CM | POA: Diagnosis present

## 2019-01-08 DIAGNOSIS — E538 Deficiency of other specified B group vitamins: Secondary | ICD-10-CM | POA: Diagnosis present

## 2019-01-08 DIAGNOSIS — K5900 Constipation, unspecified: Secondary | ICD-10-CM | POA: Diagnosis not present

## 2019-01-08 DIAGNOSIS — Z882 Allergy status to sulfonamides status: Secondary | ICD-10-CM

## 2019-01-08 DIAGNOSIS — Z885 Allergy status to narcotic agent status: Secondary | ICD-10-CM

## 2019-01-08 DIAGNOSIS — N200 Calculus of kidney: Secondary | ICD-10-CM

## 2019-01-08 DIAGNOSIS — I739 Peripheral vascular disease, unspecified: Secondary | ICD-10-CM | POA: Diagnosis present

## 2019-01-08 DIAGNOSIS — G92 Toxic encephalopathy: Secondary | ICD-10-CM | POA: Diagnosis not present

## 2019-01-08 DIAGNOSIS — G9340 Encephalopathy, unspecified: Secondary | ICD-10-CM | POA: Diagnosis not present

## 2019-01-08 DIAGNOSIS — Z8679 Personal history of other diseases of the circulatory system: Secondary | ICD-10-CM

## 2019-01-08 DIAGNOSIS — R490 Dysphonia: Secondary | ICD-10-CM | POA: Diagnosis present

## 2019-01-08 LAB — CBC WITH DIFFERENTIAL/PLATELET
Abs Immature Granulocytes: 0.02 10*3/uL (ref 0.00–0.07)
Basophils Absolute: 0 10*3/uL (ref 0.0–0.1)
Basophils Relative: 0 %
Eosinophils Absolute: 0 10*3/uL (ref 0.0–0.5)
Eosinophils Relative: 0 %
HCT: 32.8 % — ABNORMAL LOW (ref 39.0–52.0)
Hemoglobin: 10.2 g/dL — ABNORMAL LOW (ref 13.0–17.0)
Immature Granulocytes: 0 %
Lymphocytes Relative: 7 %
Lymphs Abs: 0.6 10*3/uL — ABNORMAL LOW (ref 0.7–4.0)
MCH: 31.5 pg (ref 26.0–34.0)
MCHC: 31.1 g/dL (ref 30.0–36.0)
MCV: 101.2 fL — ABNORMAL HIGH (ref 80.0–100.0)
Monocytes Absolute: 0.9 10*3/uL (ref 0.1–1.0)
Monocytes Relative: 11 %
Neutro Abs: 7 10*3/uL (ref 1.7–7.7)
Neutrophils Relative %: 82 %
Platelets: 168 10*3/uL (ref 150–400)
RBC: 3.24 MIL/uL — ABNORMAL LOW (ref 4.22–5.81)
RDW: 14.2 % (ref 11.5–15.5)
WBC: 8.5 10*3/uL (ref 4.0–10.5)
nRBC: 0 % (ref 0.0–0.2)

## 2019-01-08 LAB — URINALYSIS, ROUTINE W REFLEX MICROSCOPIC
Bacteria, UA: NONE SEEN
Bilirubin Urine: NEGATIVE
Glucose, UA: NEGATIVE mg/dL
Ketones, ur: 5 mg/dL — AB
Nitrite: NEGATIVE
Protein, ur: 100 mg/dL — AB
RBC / HPF: >50 RBC/hpf — ABNORMAL HIGH (ref 0–5)
Specific Gravity, Urine: 1.021 (ref 1.005–1.030)
pH: 5 (ref 5.0–8.0)

## 2019-01-08 LAB — COMPREHENSIVE METABOLIC PANEL
ALT: 20 U/L (ref 0–44)
AST: 25 U/L (ref 15–41)
Albumin: 3.6 g/dL (ref 3.5–5.0)
Alkaline Phosphatase: 31 U/L — ABNORMAL LOW (ref 38–126)
Anion gap: 12 (ref 5–15)
BUN: 28 mg/dL — ABNORMAL HIGH (ref 8–23)
CO2: 22 mmol/L (ref 22–32)
Calcium: 9 mg/dL (ref 8.9–10.3)
Chloride: 105 mmol/L (ref 98–111)
Creatinine, Ser: 1.79 mg/dL — ABNORMAL HIGH (ref 0.61–1.24)
GFR calc Af Amer: 39 mL/min — ABNORMAL LOW (ref >60)
GFR calc non Af Amer: 34 mL/min — ABNORMAL LOW (ref >60)
Glucose, Bld: 117 mg/dL — ABNORMAL HIGH (ref 70–99)
Potassium: 3.6 mmol/L (ref 3.5–5.1)
Sodium: 139 mmol/L (ref 135–145)
Total Bilirubin: 0.5 mg/dL (ref 0.3–1.2)
Total Protein: 6.4 g/dL — ABNORMAL LOW (ref 6.5–8.1)

## 2019-01-08 LAB — LACTIC ACID, PLASMA: Lactic Acid, Venous: 0.9 mmol/L (ref 0.5–1.9)

## 2019-01-08 LAB — BRAIN NATRIURETIC PEPTIDE: B Natriuretic Peptide: 299.4 pg/mL — ABNORMAL HIGH (ref 0.0–100.0)

## 2019-01-08 LAB — SARS CORONAVIRUS 2 BY RT PCR (HOSPITAL ORDER, PERFORMED IN ~~LOC~~ HOSPITAL LAB): SARS Coronavirus 2: NEGATIVE

## 2019-01-08 LAB — PROCALCITONIN: Procalcitonin: <0.1 ng/mL

## 2019-01-08 MED ORDER — ALBUTEROL SULFATE (2.5 MG/3ML) 0.083% IN NEBU: 2.5000 mg | INHALATION_SOLUTION | RESPIRATORY_TRACT | Status: DC | PRN

## 2019-01-08 MED ORDER — FUROSEMIDE 10 MG/ML IJ SOLN
40.0000 mg | Freq: Every day | INTRAMUSCULAR | Status: DC
  Administered 2019-01-08 – 2019-01-09 (×2): 40 mg via INTRAVENOUS
  Filled 2019-01-08 (×3): qty 4

## 2019-01-08 MED ORDER — LEVALBUTEROL HCL 0.63 MG/3ML IN NEBU: 0.6300 mg | INHALATION_SOLUTION | Freq: Four times a day (QID) | RESPIRATORY_TRACT | Status: DC

## 2019-01-08 MED ORDER — CHLORHEXIDINE GLUCONATE CLOTH 2 % EX PADS
6.0000 | MEDICATED_PAD | Freq: Every day | CUTANEOUS | Status: DC
  Administered 2019-01-12 – 2019-01-28 (×16): 6 via TOPICAL

## 2019-01-08 MED ORDER — DOCUSATE SODIUM 100 MG PO CAPS: 100.0000 mg | ORAL_CAPSULE | Freq: Every evening | ORAL | Status: DC | PRN

## 2019-01-08 MED ORDER — TRAMADOL HCL 50 MG PO TABS: 50.0000 mg | ORAL_TABLET | Freq: Two times a day (BID) | ORAL | Status: DC | PRN

## 2019-01-08 MED ORDER — ONDANSETRON HCL 4 MG PO TABS: 4.0000 mg | ORAL_TABLET | Freq: Four times a day (QID) | ORAL | Status: DC | PRN

## 2019-01-08 MED ORDER — FUROSEMIDE 10 MG/ML IJ SOLN
40.0000 mg | Freq: Once | INTRAMUSCULAR | Status: AC
  Administered 2019-01-08: 40 mg via INTRAVENOUS
  Filled 2019-01-08: qty 4

## 2019-01-08 MED ORDER — LOSARTAN POTASSIUM 25 MG PO TABS: 25.0000 mg | ORAL_TABLET | Freq: Every day | ORAL | Status: DC

## 2019-01-08 MED ORDER — ACETAMINOPHEN 325 MG PO TABS
650.0000 mg | ORAL_TABLET | Freq: Four times a day (QID) | ORAL | Status: DC | PRN
  Administered 2019-01-08: 650 mg via ORAL
  Filled 2019-01-08: qty 2

## 2019-01-08 MED ORDER — SODIUM CHLORIDE 0.9 % IV SOLN
2.0000 g | Freq: Two times a day (BID) | INTRAVENOUS | Status: DC
  Administered 2019-01-08 – 2019-01-09 (×2): 2 g via INTRAVENOUS
  Filled 2019-01-08 (×2): qty 2

## 2019-01-08 MED ORDER — ONDANSETRON HCL 4 MG/2ML IJ SOLN: 4.0000 mg | Freq: Four times a day (QID) | INTRAMUSCULAR | Status: DC | PRN

## 2019-01-08 MED ORDER — PREDNISONE 20 MG PO TABS: 10.0000 mg | ORAL_TABLET | Freq: Every day | ORAL | Status: DC

## 2019-01-08 MED ORDER — ACETAMINOPHEN 650 MG RE SUPP
650.0000 mg | Freq: Four times a day (QID) | RECTAL | Status: DC | PRN
  Administered 2019-01-20: 20:00:00 650 mg via RECTAL
  Filled 2019-01-08: qty 1

## 2019-01-08 MED ORDER — FOLIC ACID 1 MG PO TABS
1.0000 mg | ORAL_TABLET | Freq: Every day | ORAL | Status: DC
  Administered 2019-01-09 (×2): 1 mg via ORAL
  Filled 2019-01-08 (×3): qty 1

## 2019-01-08 MED ORDER — PREDNISONE 20 MG PO TABS: 60.0000 mg | ORAL_TABLET | Freq: Every day | ORAL | Status: DC

## 2019-01-08 MED ORDER — ORAL CARE MOUTH RINSE
15.0000 mL | Freq: Two times a day (BID) | OROMUCOSAL | Status: DC
  Administered 2019-01-09 – 2019-01-12 (×5): 15 mL via OROMUCOSAL

## 2019-01-08 MED ORDER — LORATADINE 10 MG PO TABS
10.0000 mg | ORAL_TABLET | Freq: Every day | ORAL | Status: DC
  Administered 2019-01-08 – 2019-01-09 (×2): 10 mg via ORAL
  Filled 2019-01-08 (×3): qty 1

## 2019-01-08 MED ORDER — VITAMIN B-1 100 MG PO TABS
100.0000 mg | ORAL_TABLET | Freq: Every day | ORAL | Status: DC
  Administered 2019-01-08 – 2019-01-09 (×2): 100 mg via ORAL
  Filled 2019-01-08 (×3): qty 1

## 2019-01-08 MED ORDER — METHYLPREDNISOLONE SODIUM SUCC 125 MG IJ SOLR
125.0000 mg | Freq: Once | INTRAMUSCULAR | Status: AC
  Administered 2019-01-08: 22:00:00 125 mg via INTRAVENOUS
  Filled 2019-01-08: qty 2

## 2019-01-08 MED ORDER — MYCOPHENOLATE MOFETIL 500 MG PO TABS: 1000.0000 mg | ORAL_TABLET | Freq: Two times a day (BID) | ORAL | Status: DC

## 2019-01-08 MED ORDER — IPRATROPIUM-ALBUTEROL 0.5-2.5 (3) MG/3ML IN SOLN
3.0000 mL | Freq: Four times a day (QID) | RESPIRATORY_TRACT | Status: DC
  Administered 2019-01-08: 20:00:00 3 mL via RESPIRATORY_TRACT

## 2019-01-08 MED ORDER — IPRATROPIUM-ALBUTEROL 0.5-2.5 (3) MG/3ML IN SOLN
3.0000 mL | Freq: Four times a day (QID) | RESPIRATORY_TRACT | Status: DC
  Administered 2019-01-09 (×3): 3 mL via RESPIRATORY_TRACT
  Filled 2019-01-08 (×3): qty 3

## 2019-01-08 MED ORDER — IPRATROPIUM-ALBUTEROL 0.5-2.5 (3) MG/3ML IN SOLN
RESPIRATORY_TRACT | Status: AC
  Administered 2019-01-08: 3 mL via RESPIRATORY_TRACT
  Filled 2019-01-08: qty 3

## 2019-01-08 MED ORDER — PANTOPRAZOLE SODIUM 40 MG PO TBEC
40.0000 mg | DELAYED_RELEASE_TABLET | Freq: Every day | ORAL | Status: DC
  Administered 2019-01-08 – 2019-01-09 (×2): 40 mg via ORAL
  Filled 2019-01-08 (×3): qty 1

## 2019-01-08 MED ORDER — SODIUM CHLORIDE 0.9 % IV SOLN
INTRAVENOUS | Status: DC
  Administered 2019-01-08 (×2): via INTRAVENOUS

## 2019-01-08 NOTE — Consult Note (Addendum)
NAME:  Tanner [name] Flurry Jr., MRN:  [id number], DOB:  February 09, 1932, LOS: 0 ADMISSION DATE:  12/31/2018, CONSULTATION DATE:  01/01/2019 REFERRING MD:  Sharolyn DouglasEzenduka, MD CHIEF COMPLAINT:  Shortness of breath, hemoptysis  Brief History   Tanner Cox is a 85 year old male with pulmonary fibrosis on CellCept and steroids who presents with chills, shortness of breath and increased home oxygen requirement.  PCCM consulted for small volume hemoptysis.  History of present illness   Tanner Cox is a 85 year old male with pulmonary fibrosis on chronic immunosuppression (CellCept and prednisone 10 mg daily), chronic hypoxemic respiratory failure 3 to 4 L via nasal cannula who presents with shortness of breath and 1 day history of light pink sputum.  Tanner Cox has noticed worsening shortness of breath in the last week and has asked Tanner Cox wife to increase Tanner Cox oxygen from 2 to 3L to 4 to 6 L on exertion.  At baseline Tanner Cox has dyspnea on exertion but has noticed significant fatigue.  Also endorses chills.  Denies fevers, chest pain, frank hemoptysis, headaches, dizziness.  Of note, Tanner Cox was initially diagnosis with pulmonary fibrosis in January 2020.  Currently on CellCept and prednisone 10 mg daily from immunosuppression.  Last exacerbation was in May 2020 where Tanner Cox received a short course of antibiotics and steroids.  Has not been hospitalized since January.  However Tanner Cox recently presented to the ED here at [facility] yesterday for left ureteral stone status post left stent placement.  Past Medical History  COPD, AAA, CAD, Psoriatec arthritis, GERD, nephrolithiasis status post recent stent  Significant Hospital Events   [facility]  Consults:  PCCM  Procedures:    Significant Diagnostic Tests:  CXR 7/10-reticular bibasilar opacities with possible superimposed infiltrate CTA 7/8-emphysematous changes with peripheral reticular opacities Micro Data:  COVID-19 7/10 - neg BCX 7/10 UCx 7/10  Antimicrobials:   Cefepime 7/10>>  Interim history/subjective:  As above  Objective   Blood pressure 98/65, pulse (!) 110, temperature 98.8 F (37.1 C), temperature source Oral, resp. rate (!) 34, SpO2 97 %.        Intake/Output Summary (Last 24 hours) at 01/25/2019 1900 Last data filed at 01/19/2019 1638 Gross per 24 hour  Intake -  Output 1400 ml  Net -1400 ml   There were no vitals filed for this visit.  Physical Exam: General: Well-appearing, no acute distress HENT: [place], AT, OP clear, MMM, difficulty hearing Eyes: EOMI, no scleral icterus Respiratory: Bilateral crackles, rhonchi present Cardiovascular: RRR, -M/R/G, no JVD GI: BS+, soft, nontender Extremities:-Edema,-tenderness Neuro: AAO x4, CNII-XII grossly intact GU: Foley in place  Resolved Hospital Problem list     Assessment & Plan:  Acute on chronic hypoxemic respiratory failure: Secondary to pneumonia, ILD flare, pulmonary edema, emphysema/COPD exacerbation - Continue antibiotics.  De-escalate pending culture data and procalcitonin - Start steroids. Taper when clinically improved - Hold cellcept for now. Restart when clinically improving - Diurese - Scheduled Duonebs - Wean oxygen for goal SpO2 88-92% - When discharged, will need to follow-up with Primary Pulmonologist Dr. Deanna Artishorton  Hemoptysis: Small volume, likely related to infection/ILD flare - Continue to monitor  - Management as above  Pulmonary will continue to follow  Labs   CBC: Recent Labs  Lab 01/06/19 0220 01/07/19 0510 01/20/2019 1157  WBC 8.7 4.7 8.5  NEUTROABS  --   --  7.0  HGB 13.1 11.1* 10.2*  HCT 43.7 37.5* 32.8*  MCV 101.6* 103.9* 101.2*  PLT 196 170 168    Basic  Metabolic Panel: Recent Labs  Lab 01/06/19 0220 01/07/19 0510 01/01/2019 1157  NA 139 141 139  K 3.8 3.9 3.6  CL 104 106 105  CO2 25 23 22   GLUCOSE 146* 136* 117*  BUN 25* 27* 28*  CREATININE 1.74* 1.91* 1.79*  CALCIUM 9.8 8.9 9.0   GFR: Estimated Creatinine Clearance: 38  mL/min (A) (by C-G formula based on SCr of 1.79 mg/dL (H)). Recent Labs  Lab 01/06/19 0220 01/06/19 0606 01/07/19 0510 01/10/2019 1157  WBC 8.7  --  4.7 8.5  LATICACIDVEN  --  1.5  --  0.9    Liver Function Tests: Recent Labs  Lab 01/06/19 0220 01/15/2019 1157  AST 23 25  ALT 23 20  ALKPHOS 43 31*  BILITOT 0.6 0.5  PROT 8.1 6.4*  ALBUMIN 4.4 3.6   Recent Labs  Lab 01/06/19 0220  LIPASE 26   No results for input(s): AMMONIA in the last 168 hours.  ABG No results found for: PHART, PCO2ART, PO2ART, HCO3, TCO2, ACIDBASEDEF, O2SAT   Coagulation Profile: No results for input(s): INR, PROTIME in the last 168 hours.  Cardiac Enzymes: No results for input(s): CKTOTAL, CKMB, CKMBINDEX, TROPONINI in the last 168 hours.  HbA1C: No results found for: HGBA1C  CBG: No results for input(s): GLUCAP in the last 168 hours.  Review of Systems:   Review of Systems  Constitutional: Negative for chills, diaphoresis, fever, malaise/fatigue and weight loss.  HENT: Negative for congestion, ear pain and sore throat.   Respiratory: Positive for cough and shortness of breath. Negative for hemoptysis, sputum production and wheezing.   Cardiovascular: Negative for chest pain, palpitations and leg swelling.  Gastrointestinal: Negative for abdominal pain, heartburn and nausea.  Genitourinary: Negative for frequency.  Musculoskeletal: Negative for joint pain and myalgias.  Skin: Negative for itching and rash.  Neurological: Negative for dizziness, weakness and headaches.  Endo/Heme/Allergies: Does not bruise/bleed easily.  Psychiatric/Behavioral: Negative for depression. The patient is not nervous/anxious.    Past Medical History  Tanner Cox,  has a past medical history of Allergic rhinitis, Anemia, B12 deficiency, BPH (benign prostatic hyperplasia), COPD (chronic obstructive pulmonary disease) (HCC), GERD (gastroesophageal reflux disease), H/O laryngeal cancer, History of kidney stones, sleep apnea,  Hypercholesterolemia, Hypertension, PAD (peripheral artery disease) (HCC), Psoriatic arthritis (HCC), Pulmonary fibrosis (HCC), and Vitamin D deficiency.   Surgical History    Past Surgical History:  Procedure Laterality Date  . APPENDECTOMY    . Bilateral bicep tendon repair    . CYSTOSCOPY W/ URETERAL STENT PLACEMENT Left 01/06/2019   Procedure: CYSTOSCOPY WITH LEFT  RETROGRADE PYELOGRAM/URETERAL STENT PLACEMENT;  Surgeon: Sebastian AcheManny, Theodore, MD;  Location: [facility];  Service: Urology;  Laterality: Left;  . Extracopreal Shockwave Therapy for kidney stones    . HEMORRHOID SURGERY    . NECK SURGERY       Social History   reports that Tanner Cox quit smoking about 15 years ago. Tanner Cox smoking use included cigarettes. Tanner Cox has a 150.00 pack-year smoking history. Tanner Cox has never used smokeless tobacco. Tanner Cox reports current alcohol use of about 12.0 standard drinks of alcohol per week. Tanner Cox reports that Tanner Cox does not use drugs.   Family History   Tanner Cox family history includes Stroke in Tanner Cox father.   Allergies Allergies  Allergen Reactions  . Oxycodone-Acetaminophen Nausea And Vomiting  . Sulfa Antibiotics Other (See Comments)    Per wife, Tanner Cox has a hypersensitivity  . Codeine Rash  . Escitalopram Hives and Rash  . Sulfamethoxazole-Trimethoprim Nausea Only  Home Medications  Prior to Admission medications   Medication Sig Start Date End Date Taking? Authorizing Provider  cetirizine (ZYRTEC) 10 MG tablet Take 10 mg by mouth at bedtime.   Yes [provider]  Cholecalciferol (VITAMIN D) 125 MCG (5000 UT) CAPS Take 5,000 Units by mouth daily.   Yes [provider]  Cyanocobalamin (VITAMIN B-12 IJ) Inject 1 mL as directed every 28 (twenty-eight) days.   Yes [provider]  docusate sodium (COLACE) 100 MG capsule Take 100 mg by mouth at bedtime as needed for mild constipation.   Yes [provider]  Guaifenesin (MUCINEX MAXIMUM STRENGTH) 1200 MG TB12 Take 1,200 mg by mouth daily.    Yes [provider]  hydrocortisone valerate cream (WESTCORT) 0.2 % Apply 1 application topically 2 (two) times daily as needed (itching on eyelids).  12/28/18  Yes [provider]  levalbuterol (XOPENEX) 1.25 MG/0.5ML nebulizer solution Take 1.25 mg by nebulization daily as needed for wheezing (pulmonary).    Yes [provider]  losartan (COZAAR) 25 MG tablet Take 25 mg by mouth daily.   Yes [provider]  mycophenolate (CELLCEPT) 500 MG tablet Take 1,000 mg by mouth 2 (two) times daily.   Yes [provider]  pantoprazole (PROTONIX) 40 MG tablet Take 40 mg by mouth daily.   Yes [provider]  predniSONE (DELTASONE) 10 MG tablet Take 10 mg by mouth daily with breakfast.   Yes [provider]  Secukinumab, 300 MG Dose, (COSENTYX, 300 MG DOSE,) 150 MG/ML SOSY Inject 300 mg into the skin every 28 (twenty-eight) days.   Yes [provider]  traMADol (ULTRAM) 50 MG tablet Take 50 mg by mouth every 12 (twelve) hours as needed for moderate pain.   Yes [provider]  folic acid (FOLVITE) 1 MG tablet Take 1 tablet (1 mg total) by mouth daily. Patient not taking: Reported on 01/24/2019 01/07/19   Florencia Reasons, MD  thiamine 100 MG tablet Take 1 tablet (100 mg total) by mouth daily. Patient not taking: Reported on 28-Jan-2019 01/07/19   Florencia Reasons, MD     Rodman Pickle, M.D. [facility] Pulmonary/Critical Care Medicine Pager: [phone] After hours pager: [phone]

## 2019-01-08 NOTE — ED Notes (Signed)
ED TO INPATIENT HANDOFF REPORT  Name/Age/Gender Tanner Cox. 85 y.o. male  Code Status Code Status History    Date Active Date Inactive Code Status Order ID Comments User Context   01/06/2019 1046 01/07/2019 1519 Full Code [id number]  Alma Friendly, MD ED   Advance Care Planning Activity      Home/SNF/Other Home  Chief Complaint diff breathing,cough  Level of Care/Admitting Diagnosis ED Disposition    ED Disposition Condition [place]: [facility] [[id number]]  Level of Care: Stepdown [14]  Admit to SDU based on following criteria: Respiratory Distress:  Frequent assessment and/or intervention to maintain adequate ventilation/respiration, pulmonary toilet, and respiratory treatment.  Covid Evaluation: Confirmed COVID Negative  Diagnosis: Cough with hemoptysis [[id number]]  Admitting Physician: Alma Friendly [[id number]]  Attending Physician: Alma Friendly [[id number]]  Estimated length of stay: past midnight tomorrow  Certification:: I certify this patient will need inpatient services for at least 2 midnights  PT Class (Do Not Modify): Inpatient [101]  PT Acc Code (Do Not Modify): Private [1]       Medical History Past Medical History:  Diagnosis Date  . Allergic rhinitis   . Anemia   . B12 deficiency   . BPH (benign prostatic hyperplasia)   . COPD (chronic obstructive pulmonary disease) ([place])   . GERD (gastroesophageal reflux disease)   . H/O laryngeal cancer   . History of kidney stones   . Hx of sleep apnea    not on CPAP  . Hypercholesterolemia   . Hypertension   . PAD (peripheral artery disease) ([place])   . Psoriatic arthritis ([place])   . Pulmonary fibrosis ([place])   . Vitamin D deficiency     Allergies Allergies  Allergen Reactions  . Oxycodone-Acetaminophen Nausea And Vomiting  . Sulfa Antibiotics Other (See Comments)    Per wife, pt has a hypersensitivity  . Codeine Rash  . Escitalopram Hives and  Rash  . Sulfamethoxazole-Trimethoprim Nausea Only    IV Location/Drains/Wounds Patient Lines/Drains/Airways Status   Active Line/Drains/Airways    Name:   Placement date:   Placement time:   Site:   Days:   Peripheral IV 01/17/2019 Right Forearm   01/17/2019    1201    Forearm   less than 1   Ureteral Drain/Stent Left ureter 5 Fr.   01/06/19    1928    Left ureter   2          Labs/Imaging Results for orders placed or performed during the hospital encounter of 01/02/2019 (from the past 48 hour(s))  CBC with Differential/Platelet     Status: Abnormal   Collection Time: 01/04/2019 11:57 AM  Result Value Ref Range   WBC 8.5 4.0 - 10.5 K/uL   RBC 3.24 (L) 4.22 - 5.81 MIL/uL   Hemoglobin 10.2 (L) 13.0 - 17.0 g/dL   HCT 32.8 (L) 39.0 - 52.0 %   MCV 101.2 (H) 80.0 - 100.0 fL   MCH 31.5 26.0 - 34.0 pg   MCHC 31.1 30.0 - 36.0 g/dL   RDW 14.2 11.5 - 15.5 %   Platelets 168 150 - 400 K/uL   nRBC 0.0 0.0 - 0.2 %   Neutrophils Relative % 82 %   Neutro Abs 7.0 1.7 - 7.7 K/uL   Lymphocytes Relative 7 %   Lymphs Abs 0.6 (L) 0.7 - 4.0 K/uL   Monocytes Relative 11 %   Monocytes Absolute 0.9 0.1 - 1.0 K/uL  Eosinophils Relative 0 %   Eosinophils Absolute 0.0 0.0 - 0.5 K/uL   Basophils Relative 0 %   Basophils Absolute 0.0 0.0 - 0.1 K/uL   Immature Granulocytes 0 %   Abs Immature Granulocytes 0.02 0.00 - 0.07 K/uL    Comment: Performed at [facility], [address] [place], [place] [address]  Comprehensive metabolic panel     Status: Abnormal   Collection Time: 01/25/2019 11:57 AM  Result Value Ref Range   Sodium 139 135 - 145 mmol/L   Potassium 3.6 3.5 - 5.1 mmol/L   Chloride 105 98 - 111 mmol/L   CO2 22 22 - 32 mmol/L   Glucose, Bld 117 (H) 70 - 99 mg/dL   BUN 28 (H) 8 - 23 mg/dL   Creatinine, Ser 6.041.79 (H) 0.61 - 1.24 mg/dL   Calcium 9.0 8.9 - 54.010.3 mg/dL   Total Protein 6.4 (L) 6.5 - 8.1 g/dL   Albumin 3.6 3.5 - 5.0 g/dL   AST 25 15 - 41 U/L   ALT 20 0 - 44 U/L    Alkaline Phosphatase 31 (L) 38 - 126 U/L   Total Bilirubin 0.5 0.3 - 1.2 mg/dL   GFR calc non Af Amer 34 (L) >60 mL/min   GFR calc Af Amer 39 (L) >60 mL/min   Anion gap 12 5 - 15    Comment: Performed at [facility], [address] [place], [place] [address]  Brain natriuretic peptide     Status: Abnormal   Collection Time: 01/13/2019 11:57 AM  Result Value Ref Range   B Natriuretic Peptide 299.4 (H) 0.0 - 100.0 pg/mL    Comment: Performed at [facility], [address] [place], [place] [address]  Lactic acid, plasma     Status: None   Collection Time: 01/07/2019 11:57 AM  Result Value Ref Range   Lactic Acid, Venous 0.9 0.5 - 1.9 mmol/L    Comment: Performed at [facility], [address] [place], [place] [address]  Urinalysis, Routine w reflex microscopic     Status: Abnormal   Collection Time: 01/27/2019 12:57 PM  Result Value Ref Range   Color, Urine YELLOW YELLOW   APPearance CLOUDY (A) CLEAR   Specific Gravity, Urine 1.021 1.005 - 1.030   pH 5.0 5.0 - 8.0   Glucose, UA NEGATIVE NEGATIVE mg/dL   Hgb urine dipstick LARGE (A) NEGATIVE   Bilirubin Urine NEGATIVE NEGATIVE   Ketones, ur 5 (A) NEGATIVE mg/dL   Protein, ur 308100 (A) NEGATIVE mg/dL   Nitrite NEGATIVE NEGATIVE   Leukocytes,Ua TRACE (A) NEGATIVE   RBC / HPF >50 (H) 0 - 5 RBC/hpf   WBC, UA 21-50 0 - 5 WBC/hpf   Bacteria, UA NONE SEEN NONE SEEN   Squamous Epithelial / LPF 0-5 0 - 5   Mucus PRESENT     Comment: Performed at [facility], [address] [place], [place] [address]  SARS Coronavirus 2 (CEPHEID- Performed in [facility] hospital lab), Hosp Order     Status: None   Collection Time: 01/29/2019 12:57 PM   Specimen: Nasopharyngeal Swab  Result Value Ref Range   SARS Coronavirus 2 NEGATIVE NEGATIVE    Comment: (NOTE) If result is NEGATIVE SARS-CoV-2 target nucleic acids are NOT DETECTED. The SARS-CoV-2 RNA is generally detectable in  upper and lower  respiratory specimens during the acute phase of infection. The lowest  concentration of SARS-CoV-2 viral copies this assay  can detect is 250  copies / mL. A negative result does not preclude SARS-CoV-2 infection  and should not be used as the sole basis for treatment or other  patient management decisions.  A negative result may occur with  improper specimen collection / handling, submission of specimen other  than nasopharyngeal swab, presence of viral mutation(s) within the  areas targeted by this assay, and inadequate number of viral copies  (<250 copies / mL). A negative result must be combined with clinical  observations, patient history, and epidemiological information. If result is POSITIVE SARS-CoV-2 target nucleic acids are DETECTED. The SARS-CoV-2 RNA is generally detectable in upper and lower  respiratory specimens dur ing the acute phase of infection.  Positive  results are indicative of active infection with SARS-CoV-2.  Clinical  correlation with patient history and other diagnostic information is  necessary to determine patient infection status.  Positive results do  not rule out bacterial infection or co-infection with other viruses. If result is PRESUMPTIVE POSTIVE SARS-CoV-2 nucleic acids MAY BE PRESENT.   A presumptive positive result was obtained on the submitted specimen  and confirmed on repeat testing.  While 2019 novel coronavirus  (SARS-CoV-2) nucleic acids may be present in the submitted sample  additional confirmatory testing may be necessary for epidemiological  and / or clinical management purposes  to differentiate between  SARS-CoV-2 and other Sarbecovirus currently known to infect humans.  If clinically indicated additional testing with an alternate test  methodology [phone]([id number]) is advised. The SARS-CoV-2 RNA is generally  detectable in upper and lower respiratory sp ecimens during the acute  phase of infection. The expected result is  Negative. Fact Sheet for Patients:  BoilerBrush.com.cyhttps://www.fda.gov/media/136312/download Fact Sheet for Healthcare Providers: https://pope.com/https://www.fda.gov/media/136313/download This test is not yet approved or cleared by the [place] FDA and has been authorized for detection and/or diagnosis of SARS-CoV-2 by FDA under an Emergency Use Authorization (EUA).  This EUA will remain in effect (meaning this test can be used) for the duration of the COVID-19 declaration under Section 564(b)(1) of the Act, 21 U.S.C. section 360bbb-3(b)(1), unless the authorization is terminated or revoked sooner. Performed at [facility], [address] [place], [place] [address]    Dg Chest 2 View  Result Date: 01/03/2019 CLINICAL DATA:  Shortness of breath. EXAM: CHEST - 2 VIEW COMPARISON:  CT scan of January 06, 2019. FINDINGS: Mild cardiomegaly is noted. No pneumothorax is noted. Mild bibasilar reticular densities are noted which may represent fibrotic change, but superimposed acute inflammation or edema cannot be excluded. No significant pleural effusion is noted. Bony thorax is unremarkable. IMPRESSION: Mild bibasilar reticular densities are noted which may represent fibrotic change, but superimposed acute inflammation or edema cannot be excluded. Electronically Signed   By: Lupita RaiderJames  Green Jr M.D.   On: 01/07/2019 11:54   Dg C-arm 1-60 Min-no Report  Result Date: 01/06/2019 Fluoroscopy was utilized by the requesting physician.  No radiographic interpretation.    Pending Labs Unresulted Labs (From admission, onward)    Start     Ordered   01/07/2019 1130  Culture, blood (Routine X 2) w Reflex to ID Panel  BLOOD CULTURE X 2,   R (with STAT occurrences)     01/19/2019 1129   01/23/2019 1128  Urine Culture  ONCE - STAT,   STAT     01/12/2019 1127   Signed and Held  Basic metabolic panel  Tomorrow morning,   R     Signed and Held  Signed and Held  CBC  Tomorrow morning,   R     Signed and Held           Vitals/Pain Today's Vitals   01/06/2019 1600 01/12/2019 1700 01/06/2019 1730 01/11/2019 1800  BP: (!) 154/97 127/74 (!) 132/100 98/65  Pulse: (!) 102 (!) 52 (!) 104 (!) 110  Resp: 17 20 17  (!) 34  Temp:      TempSrc:      SpO2: 98% 99% 98% 97%  PainSc:        Isolation Precautions Airborne and Contact precautions  Medications Medications  0.9 %  sodium chloride infusion ( Intravenous New Bag/Given 01/22/2019 1202)  loratadine (CLARITIN) tablet 10 mg (has no administration in time range)  docusate sodium (COLACE) capsule 100 mg (has no administration in time range)  folic acid (FOLVITE) tablet 1 mg (has no administration in time range)  losartan (COZAAR) tablet 25 mg (has no administration in time range)  mycophenolate (CELLCEPT) tablet 1,000 mg (has no administration in time range)  pantoprazole (PROTONIX) EC tablet 40 mg (has no administration in time range)  predniSONE (DELTASONE) tablet 10 mg (has no administration in time range)  thiamine tablet 100 mg (has no administration in time range)  traMADol (ULTRAM) tablet 50 mg (has no administration in time range)  ceFEPIme (MAXIPIME) 2 g in sodium chloride 0.9 % 100 mL IVPB (has no administration in time range)  furosemide (LASIX) injection 40 mg (40 mg Intravenous Given 01/06/2019 1440)    Mobility walks

## 2019-01-08 NOTE — ED Provider Notes (Signed)
[facility]-EMERGENCY DEPT Provider Note   CSN: [id number] Arrival date & time: 01/16/2019  1055     History   Chief Complaint Chief Complaint  Patient presents with  . Shortness of Breath  . Hemoptysis    HPI Tanner [name] Farve Jr. is a 85 y.o. male.     This is a 85 year old male who presents with 2 days of generalized weakness as well as increased cough which she said was associate with some hemoptysis today.  2 days ago patient had surgery for kidney stones.  Denies any dysuria hematuria.  No abdominal discomfort.  Denies any fever or chills.  Does note increased dyspnea on exertion as well as worsening bilateral lower extremity edema.  No anginal type symptoms.  Denies any prior history of CHF.  No treatment use prior to arrival.     Past Medical History:  Diagnosis Date  . Allergic rhinitis   . Anemia   . B12 deficiency   . BPH (benign prostatic hyperplasia)   . COPD (chronic obstructive pulmonary disease) (HCC)   . GERD (gastroesophageal reflux disease)   . H/O laryngeal cancer   . History of kidney stones   . Hx of sleep apnea    not on CPAP  . Hypercholesterolemia   . Hypertension   . PAD (peripheral artery disease) (HCC)   . Psoriatic arthritis (HCC)   . Pulmonary fibrosis (HCC)   . Vitamin D deficiency     Patient Active Problem List   Diagnosis Date Noted  . Left nephrolithiasis 01/06/2019    Past Surgical History:  Procedure Laterality Date  . APPENDECTOMY    . Bilateral bicep tendon repair    . CYSTOSCOPY W/ URETERAL STENT PLACEMENT Left 01/06/2019   Procedure: CYSTOSCOPY WITH LEFT  RETROGRADE PYELOGRAM/URETERAL STENT PLACEMENT;  Surgeon: Sebastian AcheManny, Theodore, MD;  Location: [facility];  Service: Urology;  Laterality: Left;  . Extracopreal Shockwave Therapy for kidney stones    . HEMORRHOID SURGERY    . NECK SURGERY          Home Medications    Prior to Admission medications   Medication Sig Start Date End Date Taking? Authorizing  Provider  cetirizine (ZYRTEC) 10 MG tablet Take 10 mg by mouth at bedtime.    [provider]  Cholecalciferol (VITAMIN D) 125 MCG (5000 UT) CAPS Take 5,000 Units by mouth daily.    [provider]  Cyanocobalamin (VITAMIN B-12 IJ) Inject 1 mL as directed every 28 (twenty-eight) days.    [provider]  docusate sodium (COLACE) 100 MG capsule Take 100 mg by mouth at bedtime as needed for mild constipation.    [provider]  folic acid (FOLVITE) 1 MG tablet Take 1 tablet (1 mg total) by mouth daily. 01/07/19   Albertine GratesXu, Fang, MD  Guaifenesin [facility](MUCINEX MAXIMUM STRENGTH) 1200 MG TB12 Take 1,200 mg by mouth daily.    [provider]  levalbuterol (XOPENEX) 1.25 MG/0.5ML nebulizer solution Take 1.25 mg by nebulization daily as needed (pulmonary).    [provider]  losartan (COZAAR) 25 MG tablet Take 25 mg by mouth daily.    [provider]  mycophenolate (CELLCEPT) 500 MG tablet Take 1,000 mg by mouth 2 (two) times daily.    [provider]  pantoprazole (PROTONIX) 40 MG tablet Take 40 mg by mouth daily.    [provider]  predniSONE (DELTASONE) 10 MG tablet Take 10 mg by mouth daily with breakfast.    [provider]  Secukinumab, 300 MG Dose, (COSENTYX, 300 MG DOSE,) 150 MG/ML SOSY Inject 300 mg into the skin every 28 (twenty-eight) days.    [provider]  thiamine 100 MG tablet Take 1 tablet (100 mg total) by mouth daily. 01/07/19   Florencia Reasons, MD  traMADol (ULTRAM) 50 MG tablet Take 50 mg by mouth every 12 (twelve) hours as needed for moderate pain.    [provider]    Family History Family History  Problem Relation Age of Onset  . Stroke Father     Social History Social History   Tobacco Use  . Smoking status: Former Smoker    Packs/day: 3.00    Years: 50.00    Pack years: 150.00    Types: Cigarettes    Quit date: 07/02/2003    Years since quitting: 15.5  . Smokeless tobacco: Never  Used  Substance Use Topics  . Alcohol use: Yes    Alcohol/week: 12.0 standard drinks    Types: 12 Cans of beer per week    Comment: 12 drinks per week  . Drug use: Never     Allergies   Oxycodone-acetaminophen, Codeine, and Sulfamethoxazole-trimethoprim   Review of Systems Review of Systems  All other systems reviewed and are negative.    Physical Exam Updated Vital Signs BP (!) 130/55 (BP Location: Right Arm)   Pulse 96   Temp 98.8 F (37.1 C) (Oral)   Resp (!) 23   SpO2 91%   Physical Exam Vitals signs and nursing note reviewed.  Constitutional:      General: He is not in acute distress.    Appearance: Normal appearance. He is well-developed. He is not toxic-appearing.  HENT:     Head: Normocephalic and atraumatic.  Eyes:     General: Lids are normal.     Conjunctiva/sclera: Conjunctivae normal.     Pupils: Pupils are equal, round, and reactive to light.  Neck:     Musculoskeletal: Normal range of motion and neck supple.     Thyroid: No thyroid mass.     Trachea: No tracheal deviation.  Cardiovascular:     Rate and Rhythm: Regular rhythm. Tachycardia present.     Heart sounds: Normal heart sounds. No murmur. No gallop.   Pulmonary:     Effort: Pulmonary effort is normal. Tachypnea present. No respiratory distress.     Breath sounds: Decreased air movement present. No stridor. Examination of the right-upper field reveals decreased breath sounds and rhonchi. Examination of the left-upper field reveals decreased breath sounds and rhonchi. Decreased breath sounds and rhonchi present. No wheezing or rales.  Abdominal:     General: Bowel sounds are normal. There is no distension.     Palpations: Abdomen is soft.     Tenderness: There is no abdominal tenderness. There is no rebound.  Musculoskeletal: Normal range of motion.        General: No tenderness.  Skin:    General: Skin is warm and dry.     Findings: No abrasion or rash.  Neurological:     Mental Status:  He is alert and oriented to person, place, and time.     GCS: GCS eye subscore is 4. GCS verbal subscore is 5. GCS motor subscore is 6.     Cranial Nerves: No cranial nerve deficit.     Sensory: No sensory deficit.  Psychiatric:        Speech: Speech normal.        Behavior: Behavior normal.  ED Treatments / Results  Labs (all labs ordered are listed, but only abnormal results are displayed) Labs Reviewed  URINE CULTURE  CBC WITH DIFFERENTIAL/PLATELET  COMPREHENSIVE METABOLIC PANEL  BRAIN NATRIURETIC PEPTIDE  URINALYSIS, ROUTINE W REFLEX MICROSCOPIC    EKG EKG Interpretation  Date/Time:  Friday January 08 2019 11:21:43 EDT Ventricular Rate:  127 PR Interval:    QRS Duration: 113 QT Interval:  328 QTC Calculation: 452 R Axis:   36 Text Interpretation:  Sinus tachycardia Multiform ventricular premature complexes Incomplete left bundle branch block Low voltage, precordial leads Baseline wander in lead(s) V6 Confirmed by Lorre NickAllen, Jai Steil ([id number]) on 01/21/2019 11:29:04 AM Also confirmed by Lorre NickAllen, Antwone Capozzoli ([id number]), editor Elita QuickWatlington, Beverly ([id number])  on 01/03/2019 2:13:32 PM   Radiology Dg C-arm 1-60 Min-no Report  Result Date: 01/06/2019 Fluoroscopy was utilized by the requesting physician.  No radiographic interpretation.    Procedures Procedures (including critical care time)  Medications Ordered in ED Medications  0.9 %  sodium chloride infusion (has no administration in time range)     Initial Impression / Assessment and Plan / ED Course  I have reviewed the triage vital signs and the nursing notes.  Pertinent labs & imaging results that were available during my care of the patient were reviewed by me and considered in my medical decision making (see chart for details).        Patient here with tachycardia as well as pink-tinged coughing.  Chest x-ray consistent with prior pulmonary fibrosis and probable CHF.  BNP is elevated here.  Patient given Lasix and will be  admitted to the hospital service  Final Clinical Impressions(s) / ED Diagnoses   Final diagnoses:  None    ED Discharge Orders    None       Lorre NickAllen, Azaan Leask, MD 01/02/2019 1441

## 2019-01-08 NOTE — ED Notes (Signed)
Oxygen applied at 5 L at this time. Provider aware.

## 2019-01-08 NOTE — ED Notes (Signed)
Patient transported to X-ray 

## 2019-01-08 NOTE — Progress Notes (Signed)
Pharmacy Antibiotic Note  Rees Santistevan. is a 85 y.o. male admitted on 01/03/2019 with pneumonia.  Pharmacy has been consulted for cefepime dosing.  Plan: Cefepime 2g IV q12 per current renal function     Temp (24hrs), Avg:98.8 F (37.1 C), Min:98.8 F (37.1 C), Max:98.8 F (37.1 C)  Recent Labs  Lab 01/06/19 0220 01/06/19 0606 01/07/19 0510 12/31/2018 1157  WBC 8.7  --  4.7 8.5  CREATININE 1.74*  --  1.91* 1.79*  LATICACIDVEN  --  1.5  --  0.9    Estimated Creatinine Clearance: 38 mL/min (A) (by C-G formula based on SCr of 1.79 mg/dL (H)).    Allergies  Allergen Reactions  . Oxycodone-Acetaminophen Nausea And Vomiting  . Sulfa Antibiotics Other (See Comments)    Per wife, pt has a hypersensitivity  . Codeine Rash  . Escitalopram Hives and Rash  . Sulfamethoxazole-Trimethoprim Nausea Only    Thank you for allowing pharmacy to be a part of this patient's care.  Kara Mead 01/14/2019 5:01 PM

## 2019-01-08 NOTE — ED Triage Notes (Signed)
Patient dropped off.    Patient states he was recently admitted for shob and discharged yesterday.   C/O shob and coughing up blood.    A/Ox4 Patient is on 4.5 litters home 02 in triage at 83-90%.

## 2019-01-08 NOTE — ED Notes (Signed)
ED Provider at bedside. 

## 2019-01-08 NOTE — H&P (Signed)
History and Physical  Tanner Cox. YIF:[id number] DOB: Mar 20, 1932 DOA: 01/04/2019    Patient coming from: Home & is able to ambulate  Chief Complaint: Worsening shortness of breath, cough with blood tinged sputum x1 day  HPI: Tanner Cox. is a 85 y.o. male with medical history significant for hypertension, COPD, pulmonary fibrosis, oxygen dependent, AAA, CAD, nephrolithiasis, psoriatic arthritis, GERD, presents to the ED complaining of worsening shortness of breath, with productive cough with blood tinged sputum x1 day.  Of note, patient was recently discharged on 01/07/2019 after management for left UPJ nephrolithiasis status post stent placement on 01/06/2019.  Shortly after discharge, patient noted worsening dyspnea on exertion, requiring increase in his oxygen, also noted about 3 episodes of productive cough with blood-tinged sputum, prompting patient to seek medical care.  Patient denies any chest pain, abdominal pain, fever/chills, dysuria, nausea/vomiting, diarrhea.  Patient is COVID 19 negative.  Wife at bedside.  ED Course: Patient noted to be tachycardic, saturating above 90 on about 5 L of oxygen above his baseline, afebrile, no leukocytosis, hemoglobin around his baseline 10.2.  Chest x-ray showed mild bibasilar reticular densities likely fibrotic change but superimposed acute inflammation or edema cannot be excluded.  Patient admitted for further management.  PCCM consulted.  Transfer will be admitted to stepdown unit for close observation in case of worsening hemoptysis.  Review of Systems: Review of systems are otherwise negative   Past Medical History:  Diagnosis Date  . Allergic rhinitis   . Anemia   . B12 deficiency   . BPH (benign prostatic hyperplasia)   . COPD (chronic obstructive pulmonary disease) ([place])   . GERD (gastroesophageal reflux disease)   . H/O laryngeal cancer   . History of kidney stones   . Hx of sleep apnea    not on CPAP  .  Hypercholesterolemia   . Hypertension   . PAD (peripheral artery disease) ([place])   . Psoriatic arthritis ([place])   . Pulmonary fibrosis ([place])   . Vitamin D deficiency    Past Surgical History:  Procedure Laterality Date  . APPENDECTOMY    . Bilateral bicep tendon repair    . CYSTOSCOPY W/ URETERAL STENT PLACEMENT Left 01/06/2019   Procedure: CYSTOSCOPY WITH LEFT  RETROGRADE PYELOGRAM/URETERAL STENT PLACEMENT;  Surgeon: Alexis Frock, MD;  Location: [facility];  Service: Urology;  Laterality: Left;  . Extracopreal Shockwave Therapy for kidney stones    . HEMORRHOID SURGERY    . NECK SURGERY      Social History:  reports that he quit smoking about 15 years ago. His smoking use included cigarettes. He has a 150.00 pack-year smoking history. He has never used smokeless tobacco. He reports current alcohol use of about 12.0 standard drinks of alcohol per week. He reports that he does not use drugs.   Allergies  Allergen Reactions  . Oxycodone-Acetaminophen Nausea And Vomiting  . Sulfa Antibiotics Other (See Comments)    Per wife, pt has a hypersensitivity  . Codeine Rash  . Escitalopram Hives and Rash  . Sulfamethoxazole-Trimethoprim Nausea Only    Family History  Problem Relation Age of Onset  . Stroke Father      Prior to Admission medications   Medication Sig Start Date End Date Taking? Authorizing Provider  cetirizine (ZYRTEC) 10 MG tablet Take 10 mg by mouth at bedtime.   Yes [provider]  Cholecalciferol (VITAMIN D) 125 MCG (5000 UT) CAPS Take 5,000 Units by mouth daily.   Yes [provider]  Cyanocobalamin (VITAMIN B-12 IJ) Inject 1 mL as directed every 28 (twenty-eight) days.   Yes [provider]  docusate sodium (COLACE) 100 MG capsule Take 100 mg by mouth at bedtime as needed for mild constipation.   Yes [provider]  Guaifenesin (MUCINEX MAXIMUM STRENGTH) 1200 MG TB12 Take 1,200 mg by mouth daily.   Yes [provider]   hydrocortisone valerate cream (WESTCORT) 0.2 % Apply 1 application topically 2 (two) times daily as needed (itching on eyelids).  12/28/18  Yes [provider]  levalbuterol (XOPENEX) 1.25 MG/0.5ML nebulizer solution Take 1.25 mg by nebulization daily as needed for wheezing (pulmonary).    Yes [provider]  losartan (COZAAR) 25 MG tablet Take 25 mg by mouth daily.   Yes [provider]  mycophenolate (CELLCEPT) 500 MG tablet Take 1,000 mg by mouth 2 (two) times daily.   Yes [provider]  pantoprazole (PROTONIX) 40 MG tablet Take 40 mg by mouth daily.   Yes [provider]  predniSONE (DELTASONE) 10 MG tablet Take 10 mg by mouth daily with breakfast.   Yes [provider]  Secukinumab, 300 MG Dose, (COSENTYX, 300 MG DOSE,) 150 MG/ML SOSY Inject 300 mg into the skin every 28 (twenty-eight) days.   Yes [provider]  traMADol (ULTRAM) 50 MG tablet Take 50 mg by mouth every 12 (twelve) hours as needed for moderate pain.   Yes [provider]  folic acid (FOLVITE) 1 MG tablet Take 1 tablet (1 mg total) by mouth daily. Patient not taking: Reported on 12/30/2018 01/07/19   Albertine GratesXu, Fang, MD  thiamine 100 MG tablet Take 1 tablet (100 mg total) by mouth daily. Patient not taking: Reported on 12/30/2018 01/07/19   Albertine GratesXu, Fang, MD    Physical Exam: BP 123/65   Pulse 86   Temp 98.8 F (37.1 C) (Oral)   Resp (!) 24   SpO2 94%   General: AAO x3, NAD Eyes: Normal ENT: Normal Neck: Supple Cardiovascular: S1, S2 present Respiratory: Diminished breath sounds bilaterally Abdomen: Soft, nontender, nondistended, bowel sounds present Skin: Normal Musculoskeletal: 2+ bilateral LE pitting edema noted Psychiatric: Normal mood Neurologic: No neurologic deficit noted          Labs on Admission:  Basic Metabolic Panel: Recent Labs  Lab 01/06/19 0220 01/07/19 0510 01/14/2019 1157  NA 139 141 139  K 3.8 3.9 3.6  CL 104 106 105  CO2 25 23  22   GLUCOSE 146* 136* 117*  BUN 25* 27* 28*  CREATININE 1.74* 1.91* 1.79*  CALCIUM 9.8 8.9 9.0   Liver Function Tests: Recent Labs  Lab 01/06/19 0220 01/07/2019 1157  AST 23 25  ALT 23 20  ALKPHOS 43 31*  BILITOT 0.6 0.5  PROT 8.1 6.4*  ALBUMIN 4.4 3.6   Recent Labs  Lab 01/06/19 0220  LIPASE 26   No results for input(s): AMMONIA in the last 168 hours. CBC: Recent Labs  Lab 01/06/19 0220 01/07/19 0510 01/22/2019 1157  WBC 8.7 4.7 8.5  NEUTROABS  --   --  7.0  HGB 13.1 11.1* 10.2*  HCT 43.7 37.5* 32.8*  MCV 101.6* 103.9* 101.2*  PLT 196 170 168   Cardiac Enzymes: No results for input(s): CKTOTAL, CKMB, CKMBINDEX, TROPONINI in the last 168 hours.  BNP (last 3 results) Recent Labs    01/10/2019 1157  BNP 299.4*    ProBNP (last 3 results) No results for input(s): PROBNP in the last 8760 hours.  CBG: No results  for input(s): GLUCAP in the last 168 hours.  Radiological Exams on Admission: Dg Chest 2 View  Result Date: 01/02/2019 CLINICAL DATA:  Shortness of breath. EXAM: CHEST - 2 VIEW COMPARISON:  CT scan of January 06, 2019. FINDINGS: Mild cardiomegaly is noted. No pneumothorax is noted. Mild bibasilar reticular densities are noted which may represent fibrotic change, but superimposed acute inflammation or edema cannot be excluded. No significant pleural effusion is noted. Bony thorax is unremarkable. IMPRESSION: Mild bibasilar reticular densities are noted which may represent fibrotic change, but superimposed acute inflammation or edema cannot be excluded. Electronically Signed   By: Lupita RaiderJames  Green Jr M.D.   On: 01/01/2019 11:54    EKG: Independently reviewed.  Sinus tachycardia  Assessment/Plan Present on Admission: . Cough with hemoptysis  Active Problems:   Left nephrolithiasis   Cough with hemoptysis   PAD (peripheral artery disease) (HCC)   COPD (chronic obstructive pulmonary disease) (HCC)   Psoriatic arthritis (HCC)   Pulmonary fibrosis (HCC)    Hypertension  Acute on chronic hypoxic respiratory failure Likely 2/2 ILD flare Vs H CAP Vs pulmonary edema Afebrile, with no leukocytosis Chest x-ray showed mild bibasilar reticular densities likely fibrotic change but superimposed acute inflammation or edema cannot be excluded. BNP 299, no baseline on file Lactic acid 0.9 BC x2 pending Procalcitonin pending Echo pending Start IV cefepime for HCAP coverage Continue IV Lasix for possible pulmonary edema Start PO prednisone for possible ILD flare Hold home low-dose prednisone, CellCept for now PCCM consulted, appreciate recommendations Duo nebs, supplemental oxygen Monitor in stepdown unit  Small-volume hemoptysis Likely due to ILD flare versus H CAP as mentioned above Monitor closely in stepdown unit Management as above  CKD stage III Creatinine currently around baseline Monitor BMP closely as patient is currently on Lasix  Hx of AAA, 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   01/07/2019, 8:17 PM

## 2019-01-09 ENCOUNTER — Inpatient Hospital Stay (HOSPITAL_COMMUNITY): Payer: Medicare Other

## 2019-01-09 DIAGNOSIS — J8410 Pulmonary fibrosis, unspecified: Secondary | ICD-10-CM

## 2019-01-09 DIAGNOSIS — I517 Cardiomegaly: Secondary | ICD-10-CM

## 2019-01-09 DIAGNOSIS — I480 Paroxysmal atrial fibrillation: Secondary | ICD-10-CM

## 2019-01-09 LAB — URINE CULTURE: Culture: NO GROWTH

## 2019-01-09 LAB — BASIC METABOLIC PANEL
Anion gap: 12 (ref 5–15)
BUN: 25 mg/dL — ABNORMAL HIGH (ref 8–23)
CO2: 25 mmol/L (ref 22–32)
Calcium: 8.5 mg/dL — ABNORMAL LOW (ref 8.9–10.3)
Chloride: 103 mmol/L (ref 98–111)
Creatinine, Ser: 1.78 mg/dL — ABNORMAL HIGH (ref 0.61–1.24)
GFR calc Af Amer: 39 mL/min — ABNORMAL LOW (ref >60)
GFR calc non Af Amer: 34 mL/min — ABNORMAL LOW (ref >60)
Glucose, Bld: 138 mg/dL — ABNORMAL HIGH (ref 70–99)
Potassium: 3.8 mmol/L (ref 3.5–5.1)
Sodium: 140 mmol/L (ref 135–145)

## 2019-01-09 LAB — CBC
HCT: 32.4 % — ABNORMAL LOW (ref 39.0–52.0)
Hemoglobin: 9.8 g/dL — ABNORMAL LOW (ref 13.0–17.0)
MCH: 30.7 pg (ref 26.0–34.0)
MCHC: 30.2 g/dL (ref 30.0–36.0)
MCV: 101.6 fL — ABNORMAL HIGH (ref 80.0–100.0)
Platelets: 158 10*3/uL (ref 150–400)
RBC: 3.19 MIL/uL — ABNORMAL LOW (ref 4.22–5.81)
RDW: 14 % (ref 11.5–15.5)
WBC: 4.5 10*3/uL (ref 4.0–10.5)
nRBC: 0 % (ref 0.0–0.2)

## 2019-01-09 LAB — TROPONIN I (HIGH SENSITIVITY)
Troponin I (High Sensitivity): 188 ng/L (ref <18)
Troponin I (High Sensitivity): 202 ng/L (ref <18)

## 2019-01-09 LAB — TSH: TSH: 0.538 u[IU]/mL (ref 0.350–4.500)

## 2019-01-09 LAB — PROCALCITONIN: Procalcitonin: <0.1 ng/mL

## 2019-01-09 LAB — ECHOCARDIOGRAM COMPLETE
Height: 72 in
Weight: 3742.53 oz

## 2019-01-09 LAB — MAGNESIUM: Magnesium: 1.9 mg/dL (ref 1.7–2.4)

## 2019-01-09 LAB — GLUCOSE, CAPILLARY: Glucose-Capillary: 150 mg/dL — ABNORMAL HIGH (ref 70–99)

## 2019-01-09 LAB — MRSA PCR SCREENING: MRSA by PCR: NEGATIVE

## 2019-01-09 MED ORDER — FENTANYL CITRATE (PF) 100 MCG/2ML IJ SOLN
25.0000 ug | Freq: Once | INTRAMUSCULAR | Status: AC
  Administered 2019-01-09: 25 ug via INTRAVENOUS
  Filled 2019-01-09: qty 2

## 2019-01-09 MED ORDER — SENNOSIDES-DOCUSATE SODIUM 8.6-50 MG PO TABS
2.0000 | ORAL_TABLET | Freq: Two times a day (BID) | ORAL | Status: DC
  Administered 2019-01-09: 2 via ORAL
  Filled 2019-01-09 (×3): qty 2

## 2019-01-09 MED ORDER — POLYETHYLENE GLYCOL 3350 17 G PO PACK
17.0000 g | PACK | Freq: Every day | ORAL | Status: DC
  Administered 2019-01-14 – 2019-01-15 (×2): 17 g via ORAL
  Filled 2019-01-09 (×4): qty 1

## 2019-01-09 MED ORDER — LORAZEPAM 2 MG/ML IJ SOLN
1.0000 mg | Freq: Once | INTRAMUSCULAR | Status: AC
  Administered 2019-01-09: 1 mg via INTRAVENOUS
  Filled 2019-01-09: qty 1

## 2019-01-09 MED ORDER — AMIODARONE LOAD VIA INFUSION
150.0000 mg | Freq: Once | INTRAVENOUS | Status: DC
  Filled 2019-01-09: qty 83.34

## 2019-01-09 MED ORDER — HALOPERIDOL LACTATE 5 MG/ML IJ SOLN
4.0000 mg | Freq: Once | INTRAMUSCULAR | Status: AC
  Administered 2019-01-09: 21:00:00 4 mg via INTRAVENOUS
  Filled 2019-01-09: qty 1

## 2019-01-09 MED ORDER — METOPROLOL SUCCINATE ER 25 MG PO TB24
12.5000 mg | ORAL_TABLET | Freq: Every day | ORAL | Status: DC
  Administered 2019-01-09: 12.5 mg via ORAL
  Filled 2019-01-09: qty 1

## 2019-01-09 MED ORDER — AMIODARONE HCL 200 MG PO TABS: 200.0000 mg | ORAL_TABLET | Freq: Every day | ORAL | Status: DC

## 2019-01-09 MED ORDER — PREDNISONE 10 MG PO TABS: 10.0000 mg | ORAL_TABLET | Freq: Every day | ORAL | Status: DC

## 2019-01-09 MED ORDER — SENNOSIDES-DOCUSATE SODIUM 8.6-50 MG PO TABS: 2.0000 | ORAL_TABLET | Freq: Every day | ORAL | Status: DC

## 2019-01-09 MED ORDER — AZITHROMYCIN 250 MG PO TABS: 250.0000 mg | ORAL_TABLET | Freq: Every day | ORAL | Status: DC

## 2019-01-09 MED ORDER — AZITHROMYCIN 250 MG PO TABS
500.0000 mg | ORAL_TABLET | Freq: Every day | ORAL | Status: AC
  Administered 2019-01-09: 09:00:00 500 mg via ORAL
  Filled 2019-01-09: qty 2

## 2019-01-09 MED ORDER — METOPROLOL SUCCINATE ER 25 MG PO TB24
25.0000 mg | ORAL_TABLET | Freq: Every day | ORAL | Status: DC
  Filled 2019-01-09: qty 1

## 2019-01-09 MED ORDER — IPRATROPIUM BROMIDE 0.02 % IN SOLN
0.5000 mg | Freq: Three times a day (TID) | RESPIRATORY_TRACT | Status: DC
  Administered 2019-01-10 – 2019-01-17 (×19): 0.5 mg via RESPIRATORY_TRACT
  Filled 2019-01-09 (×22): qty 2.5

## 2019-01-09 MED ORDER — MYCOPHENOLATE MOFETIL 250 MG PO CAPS
1000.0000 mg | ORAL_CAPSULE | Freq: Two times a day (BID) | ORAL | Status: DC
  Administered 2019-01-09: 1000 mg via ORAL
  Filled 2019-01-09 (×10): qty 4

## 2019-01-09 MED ORDER — AMIODARONE HCL IN DEXTROSE 360-4.14 MG/200ML-% IV SOLN: 30.0000 mg/h | INTRAVENOUS | Status: DC

## 2019-01-09 MED ORDER — DIPHENHYDRAMINE HCL 25 MG PO CAPS: 25.0000 mg | ORAL_CAPSULE | Freq: Once | ORAL | Status: DC

## 2019-01-09 MED ORDER — POTASSIUM CHLORIDE CRYS ER 20 MEQ PO TBCR
40.0000 meq | EXTENDED_RELEASE_TABLET | ORAL | Status: AC
  Administered 2019-01-09 (×2): 40 meq via ORAL
  Filled 2019-01-09 (×3): qty 2

## 2019-01-09 MED ORDER — AMIODARONE HCL 200 MG PO TABS: 200.0000 mg | ORAL_TABLET | Freq: Two times a day (BID) | ORAL | Status: DC

## 2019-01-09 MED ORDER — DOXYCYCLINE HYCLATE 100 MG PO TABS
100.0000 mg | ORAL_TABLET | Freq: Two times a day (BID) | ORAL | Status: DC
  Administered 2019-01-09: 22:00:00 100 mg via ORAL
  Filled 2019-01-09 (×2): qty 1

## 2019-01-09 MED ORDER — MAGNESIUM SULFATE 2 GM/50ML IV SOLN
2.0000 g | Freq: Once | INTRAVENOUS | Status: AC
  Administered 2019-01-09: 2 g via INTRAVENOUS
  Filled 2019-01-09: qty 50

## 2019-01-09 MED ORDER — METOPROLOL TARTRATE 5 MG/5ML IV SOLN
5.0000 mg | Freq: Four times a day (QID) | INTRAVENOUS | Status: DC | PRN
  Administered 2019-01-10 – 2019-01-12 (×9): 5 mg via INTRAVENOUS
  Filled 2019-01-09 (×11): qty 5

## 2019-01-09 MED ORDER — PREDNISONE 20 MG PO TABS: 20.0000 mg | ORAL_TABLET | Freq: Every day | ORAL | Status: DC

## 2019-01-09 MED ORDER — LEVALBUTEROL HCL 1.25 MG/0.5ML IN NEBU
1.2500 mg | INHALATION_SOLUTION | RESPIRATORY_TRACT | Status: DC | PRN
  Administered 2019-01-13: 07:00:00 1.25 mg via RESPIRATORY_TRACT
  Filled 2019-01-09: qty 0.5

## 2019-01-09 MED ORDER — AMIODARONE HCL IN DEXTROSE 360-4.14 MG/200ML-% IV SOLN: 60.0000 mg/h | INTRAVENOUS | Status: DC

## 2019-01-09 MED ORDER — DILTIAZEM HCL 100 MG IV SOLR: 5.0000 mg/h | INTRAVENOUS | Status: DC

## 2019-01-09 MED ORDER — LEVALBUTEROL HCL 1.25 MG/0.5ML IN NEBU
1.2500 mg | INHALATION_SOLUTION | Freq: Three times a day (TID) | RESPIRATORY_TRACT | Status: DC
  Administered 2019-01-10 – 2019-01-17 (×19): 1.25 mg via RESPIRATORY_TRACT
  Filled 2019-01-09 (×25): qty 0.5

## 2019-01-09 MED ORDER — METOPROLOL SUCCINATE ER 25 MG PO TB24: 12.5000 mg | ORAL_TABLET | Freq: Once | ORAL | Status: DC

## 2019-01-09 MED ORDER — PREDNISONE 20 MG PO TABS
40.0000 mg | ORAL_TABLET | Freq: Every day | ORAL | Status: DC
  Administered 2019-01-09: 09:00:00 40 mg via ORAL
  Filled 2019-01-09 (×2): qty 2

## 2019-01-09 NOTE — Progress Notes (Signed)
Location [facility] 1229  Petra Kuba of visit: Emotional Support.   Summary:  Chaplain received this consult from one of the RNs. When Chaplain arrived, wife was at bedside and Pt reported to "be fine." Pt. declined visit. Chaplain available if needed.

## 2019-01-09 NOTE — Progress Notes (Signed)
PROGRESS NOTE    Tanner LoraPrentice Brunet Jr.  ZOX:[id number] DOB: 1931/08/06 DOA: 01/02/2019 PCP: System, Pcp Not In    Brief Narrative:   85 year old gentleman with multiple comorbidities including history of hypertension, COPD with pulmonary fibrosis, chronic hypoxic failure on 3 L of oxygen at home, AAA, CAD, nephrolithiasis and recent left ureteric stent placement and planned staged procedure by urology, psoriatic arthritis, GERD who presented to the emergency room with complaints of worsening shortness of breath, productive cough and one episode of blood-tinged sputum after discharge from hospital 1 day prior for ureteric stent placement.  Patient was found to have exacerbation of underlying interstitial lung disease and is admitted to the hospital for treatment.  Followed by pulmonology.  COVID-19 negative.  Initially required 5 L oxygen and now improving.  Assessment & Plan:   Active Problems:   Left nephrolithiasis   Cough with hemoptysis   PAD (peripheral artery disease) (HCC)   COPD (chronic obstructive pulmonary disease) (HCC)   Psoriatic arthritis (HCC)   Pulmonary fibrosis (HCC)   Hypertension  Acute on chronic hypoxic respiratory failure, ILD exacerbation with small volume hemoptysis: Aggressive bronchodilator therapy, IV steroids and tapered to oral. deep breathing exercises, incentive spirometry, chest physiotherapy and respiratory therapy consult.  Start flutter valve therapy. Trial of diuretics therapy.  Repeat 2D echocardiogram today. Antibiotics due to severity of symptoms.  Currently on azithromycin. Supplemental oxygen to keep saturations more than 92%. Appreciate pulmonology input.  Interstitial lung disease: Has advanced interstitial lung disease.  Currently remains on disease modifying agent with CellCept and prednisone.  Started on high-dose prednisone with hope to resume his chronic prednisone.  Follows up with pulmonology outpatient.  Stage III CKD due to  hypertension: Fairly stable at baseline.  Getting IV Lasix.  We will continue to monitor levels.  Hypertension: Blood pressures are stable.  Resume losartan.  GERD: On PPI.  Continue.  DVT prophylaxis: SCDs Code Status: Full code Family Communication: None Disposition Plan: Inpatient   Consultants:   PCCM  Procedures:   None  Antimicrobials:   Cefepime, 01/09/2019-01/09/2019  Azithromycin, 01/09/2019---   Subjective: Patient was seen and examined at the bedside.  Was getting echocardiogram.  He has a hoarse voice which is chronic.  No overnight events.  Breathing slightly better at rest.  He has not ambulated yet.  No more bloody sputum.  Objective: Vitals:   01/09/19 0200 01/09/19 0329 01/09/19 0600 01/09/19 0854  BP: (!) 166/90  (!) 162/85   Pulse: 81  98   Resp: 16  17   Temp:  97.8 F (36.6 C)  98.1 F (36.7 C)  TempSrc:  Oral  Oral  SpO2: 98%  93%   Weight:      Height:        Intake/Output Summary (Last 24 hours) at 01/09/2019 0919 Last data filed at 01/09/2019 0600 Gross per 24 hour  Intake -  Output 2400 ml  Net -2400 ml   Filed Weights   01/29/2019 2127  Weight: 106.1 kg    Examination:  General exam: Appears calm and comfortable, on 3 L oxygen.  Chronically sick looking but not with any distress. Respiratory system: Mostly bilateral conducted airway sounds.  Occasional expiratory wheezes.  Fine crackles at bases.   Cardiovascular system: S1 & S2 heard, RRR. No JVD, murmurs, rubs, gallops or clicks. No pedal edema. Gastrointestinal system: Abdomen is nondistended, soft and nontender. No organomegaly or masses felt. Normal bowel sounds heard.  Obese and pendulous. Central nervous system: Alert  and oriented. No focal neurological deficits. Extremities: Symmetric 5 x 5 power. Skin: No rashes, lesions or ulcers Psychiatry: Judgement and insight appear normal. Mood & affect appropriate.     Data Reviewed: I have personally reviewed following labs and  imaging studies  CBC: Recent Labs  Lab 01/06/19 0220 01/07/19 0510 2019-01-11 1157 01/09/19 0315  WBC 8.7 4.7 8.5 4.5  NEUTROABS  --   --  7.0  --   HGB 13.1 11.1* 10.2* 9.8*  HCT 43.7 37.5* 32.8* 32.4*  MCV 101.6* 103.9* 101.2* 101.6*  PLT 196 170 168 158   Basic Metabolic Panel: Recent Labs  Lab 01/06/19 0220 01/07/19 0510 01-11-2019 1157 01/09/19 0315  NA 139 141 139 140  K 3.8 3.9 3.6 3.8  CL 104 106 105 103  CO2 GLUCOSE 146* 136* 117* 138*  BUN 25* 27* 28* 25*  CREATININE 1.74* 1.91* 1.79* 1.78*  CALCIUM 9.8 8.9 9.0 8.5*  MG  --   --   --  1.9   GFR: Estimated Creatinine Clearance: 38.2 mL/min (A) (by C-G formula based on SCr of 1.78 mg/dL (H)). Liver Function Tests: Recent Labs  Lab 01/06/19 0220 01-11-19 1157  AST 23 25  ALT 23 20  ALKPHOS 43 31*  BILITOT 0.6 0.5  PROT 8.1 6.4*  ALBUMIN 4.4 3.6   Recent Labs  Lab 01/06/19 0220  LIPASE 26   No results for input(s): AMMONIA in the last 168 hours. Coagulation Profile: No results for input(s): INR, PROTIME in the last 168 hours. Cardiac Enzymes: No results for input(s): CKTOTAL, CKMB, CKMBINDEX, TROPONINI in the last 168 hours. BNP (last 3 results) No results for input(s): PROBNP in the last 8760 hours. HbA1C: No results for input(s): HGBA1C in the last 72 hours. CBG: Recent Labs  Lab 01/09/19 0742  GLUCAP 150*   Lipid Profile: No results for input(s): CHOL, HDL, LDLCALC, TRIG, CHOLHDL, LDLDIRECT in the last 72 hours. Thyroid Function Tests: No results for input(s): TSH, T4TOTAL, FREET4, T3FREE, THYROIDAB in the last 72 hours. Anemia Panel: No results for input(s): VITAMINB12, FOLATE, FERRITIN, TIBC, IRON, RETICCTPCT in the last 72 hours. Sepsis Labs: Recent Labs  Lab 01/06/19 0606 January 11, 2019 1157 2019/01/11 2047 01/09/19 0315  PROCALCITON  --   --  <0.10 <0.10  LATICACIDVEN 1.5 0.9  --   --     Recent Results (from the past 240 hour(s))  SARS Coronavirus 2 (CEPHEID -  Performed in [facility] hospital lab), Hosp Order     Status: None   Collection Time: 01/06/19  3:18 AM   Specimen: Nasopharyngeal Swab  Result Value Ref Range Status   SARS Coronavirus 2 NEGATIVE NEGATIVE Final    Comment: (NOTE) If result is NEGATIVE SARS-CoV-2 target nucleic acids are NOT DETECTED. The SARS-CoV-2 RNA is generally detectable in upper and lower  respiratory specimens during the acute phase of infection. The lowest  concentration of SARS-CoV-2 viral copies this assay can detect is 250  copies / mL. A negative result does not preclude SARS-CoV-2 infection  and should not be used as the sole basis for treatment or other  patient management decisions.  A negative result may occur with  improper specimen collection / handling, submission of specimen other  than nasopharyngeal swab, presence of viral mutation(s) within the  areas targeted by this assay, and inadequate number of viral copies  (<250 copies / mL). A negative result must be combined with clinical  observations, patient history, and epidemiological information.  If result is POSITIVE SARS-CoV-2 target nucleic acids are DETECTED. The SARS-CoV-2 RNA is generally detectable in upper and lower  respiratory specimens dur ing the acute phase of infection.  Positive  results are indicative of active infection with SARS-CoV-2.  Clinical  correlation with patient history and other diagnostic information is  necessary to determine patient infection status.  Positive results do  not rule out bacterial infection or co-infection with other viruses. If result is PRESUMPTIVE POSTIVE SARS-CoV-2 nucleic acids MAY BE PRESENT.   A presumptive positive result was obtained on the submitted specimen  and confirmed on repeat testing.  While 2019 novel coronavirus  (SARS-CoV-2) nucleic acids may be present in the submitted sample  additional confirmatory testing may be necessary for epidemiological  and / or clinical management  purposes  to differentiate between  SARS-CoV-2 and other Sarbecovirus currently known to infect humans.  If clinically indicated additional testing with an alternate test  methodology [id number]) is advised. The SARS-CoV-2 RNA is generally  detectable in upper and lower respiratory sp ecimens during the acute  phase of infection. The expected result is Negative. Fact Sheet for Patients:  BoilerBrush.com.cyhttps://www.fda.gov/media/136312/download Fact Sheet for Healthcare Providers: https://pope.com/https://www.fda.gov/media/136313/download This test is not yet approved or cleared by the [place] FDA and has been authorized for detection and/or diagnosis of SARS-CoV-2 by FDA under an Emergency Use Authorization (EUA).  This EUA will remain in effect (meaning this test can be used) for the duration of the COVID-19 declaration under Section 564(b)(1) of the Act, 21 U.S.C. section 360bbb-3(b)(1), unless the authorization is terminated or revoked sooner. Performed at [facility], [address] [place], [place] [address]   MRSA PCR Screening     Status: None   Collection Time: 01/06/19  1:44 PM   Specimen: Nasal Mucosa; Nasopharyngeal  Result Value Ref Range Status   MRSA by PCR NEGATIVE NEGATIVE Final    Comment:        The GeneXpert MRSA Assay (FDA approved for NASAL specimens only), is one component of a comprehensive MRSA colonization surveillance program. It is not intended to diagnose MRSA infection nor to guide or monitor treatment for MRSA infections. Performed at [facility], [address] [place], [place] [address]   SARS Coronavirus 2 (CEPHEID- Performed in [facility] hospital lab), Hosp Order     Status: None   Collection Time: 01/27/2019 12:57 PM   Specimen: Nasopharyngeal Swab  Result Value Ref Range Status   SARS Coronavirus 2 NEGATIVE NEGATIVE Final    Comment: (NOTE) If result is NEGATIVE SARS-CoV-2 target nucleic acids are NOT DETECTED. The  SARS-CoV-2 RNA is generally detectable in upper and lower  respiratory specimens during the acute phase of infection. The lowest  concentration of SARS-CoV-2 viral copies this assay can detect is 250  copies / mL. A negative result does not preclude SARS-CoV-2 infection  and should not be used as the sole basis for treatment or other  patient management decisions.  A negative result may occur with  improper specimen collection / handling, submission of specimen other  than nasopharyngeal swab, presence of viral mutation(s) within the  areas targeted by this assay, and inadequate number of viral copies  (<250 copies / mL). A negative result must be combined with clinical  observations, patient history, and epidemiological information. If result is POSITIVE SARS-CoV-2 target nucleic acids are DETECTED. The SARS-CoV-2 RNA is generally detectable in upper and lower  respiratory specimens dur ing the acute phase of  infection.  Positive  results are indicative of active infection with SARS-CoV-2.  Clinical  correlation with patient history and other diagnostic information is  necessary to determine patient infection status.  Positive results do  not rule out bacterial infection or co-infection with other viruses. If result is PRESUMPTIVE POSTIVE SARS-CoV-2 nucleic acids MAY BE PRESENT.   A presumptive positive result was obtained on the submitted specimen  and confirmed on repeat testing.  While 2019 novel coronavirus  (SARS-CoV-2) nucleic acids may be present in the submitted sample  additional confirmatory testing may be necessary for epidemiological  and / or clinical management purposes  to differentiate between  SARS-CoV-2 and other Sarbecovirus currently known to infect humans.  If clinically indicated additional testing with an alternate test  methodology [phone]) is advised. The SARS-CoV-2 RNA is generally  detectable in upper and lower respiratory sp ecimens during the acute   phase of infection. The expected result is Negative. Fact Sheet for Patients:  StrictlyIdeas.no Fact Sheet for Healthcare Providers: BankingDealers.co.za This test is not yet approved or cleared by the [place] FDA and has been authorized for detection and/or diagnosis of SARS-CoV-2 by FDA under an Emergency Use Authorization (EUA).  This EUA will remain in effect (meaning this test can be used) for the duration of the COVID-19 declaration under Section 564(b)(1) of the Act, 21 U.S.C. section 360bbb-3(b)(1), unless the authorization is terminated or revoked sooner. Performed at [facility], [place] [address] [place], [place] [address]   MRSA PCR Screening     Status: None   Collection Time: 01/09/19  1:30 AM   Specimen: Nasal Mucosa; Nasopharyngeal  Result Value Ref Range Status   MRSA by PCR NEGATIVE NEGATIVE Final    Comment:        The GeneXpert MRSA Assay (FDA approved for NASAL specimens only), is one component of a comprehensive MRSA colonization surveillance program. It is not intended to diagnose MRSA infection nor to guide or monitor treatment for MRSA infections. Performed at [facility], [place] [address], [place], [place] [address]          Radiology Studies: Dg Chest 2 View  Result Date: 01/18/19 CLINICAL DATA:  Shortness of breath. EXAM: CHEST - 2 VIEW COMPARISON:  CT scan of January 06, 2019. FINDINGS: Mild cardiomegaly is noted. No pneumothorax is noted. Mild bibasilar reticular densities are noted which may represent fibrotic change, but superimposed acute inflammation or edema cannot be excluded. No significant pleural effusion is noted. Bony thorax is unremarkable. IMPRESSION: Mild bibasilar reticular densities are noted which may represent fibrotic change, but superimposed acute inflammation or edema cannot be excluded. Electronically Signed   By: Marijo Conception M.D.    On: 01/18/2019 11:54        Scheduled Meds: . [START ON 01/10/2019] azithromycin  250 mg Oral Daily  . Chlorhexidine Gluconate Cloth  6 each Topical Daily  . folic acid  1 mg Oral Daily  . furosemide  40 mg Intravenous Daily  . ipratropium-albuterol  3 mL Nebulization Q6H WA  . loratadine  10 mg Oral Daily  . mouth rinse  15 mL Mouth Rinse BID  . metoprolol succinate  12.5 mg Oral Daily  . mycophenolate  1,000 mg Oral BID  . pantoprazole  40 mg Oral Daily  . predniSONE  40 mg Oral Q breakfast   Followed by  . [START ON 01/14/2019] predniSONE  20 mg Oral Q breakfast   Followed by  . [  START ON 01/19/2019] predniSONE  10 mg Oral Q breakfast  . thiamine  100 mg Oral Daily   Continuous Infusions: . sodium chloride 10 mL/hr at 01/27/2019 2201     LOS: 1 day    Time spent: 25 minutes    Dorcas CarrowKuber Greyson Riccardi, MD [facility] Pager [phone]  If 7PM-7AM, please contact night-coverage [email or web address] Password [email or web address] 01/09/2019, 9:19 AM

## 2019-01-09 NOTE — Progress Notes (Addendum)
Upon entering the patient's room around 1930, the patient was very suspicious and paranoid about the assessment. This nurse went to ask the patient neurological questions:  "Can you tell me your full name and date of birth?,  What is today's date?,  Where are you at right now?". The patient then responded with vulgar statements and stated "you are insulting my intelligence". This nurse explained herself very clearly about why she asks these questions and how it is a routine process. The wife was present and also reiterrated my words because the patient stated "She does not know what she is talking about" and continued to insult this nurse and rip off lines and gown and tried to get out of bed multiple times. This nurse apologized for any inconveniences and the patient sat back down. This nurse paged the [facility]ad on-call NP and told her the situation. This NP arrived on the floor and checked on the patient. This nurse returned to the patient's room to find that the wife had stepped out and the patient proceeded to insult this nurse and the NP stating that "If that woman returns to my room, I will cut her throat and I ought to cut yours for allowing her to come in my room" and that "it's people like you and her that remind me of why people get raped behind buildings". This nurse stated to the patient, "that is inappropriate to say and we do not tolerate threats at this hospital" and the patient then decided to insult this nurse again and get out of the bed and the situation escalated. This nurse got the charge nurse and [facility] involved. This nurse then gave haldol and ativan and soft restraints, a posey belt and a safety sitter were ordered to calm down the patient. Will continue to monitor.

## 2019-01-09 NOTE — Consult Note (Signed)
NAME:  Tanner Nanna., MRN:  [id number], DOB:  [birth year], LOS: 1 ADMISSION DATE:  01/18/2019, CONSULTATION DATE:  01/26/2019 REFERRING MD:  Horris Latino, MD CHIEF COMPLAINT:  Shortness of breath, hemoptysis  Brief History   Mr. Tanner Cox is a 85 year old male with pulmonary fibrosis on CellCept and steroids who presents with chills, shortness of breath and increased home oxygen requirement.  PCCM consulted for small volume hemoptysis.  History of present illness   Mr. Tanner Cox is a 85 year old male with pulmonary fibrosis on chronic immunosuppression (CellCept and prednisone 10 mg daily), chronic hypoxemic respiratory failure 3 to 4 L via nasal cannula who presents with shortness of breath and 1 day history of light pink sputum.  He has noticed worsening shortness of breath in the last week and has asked his wife to increase his oxygen from 2 to 3L to 4 to 6 L on exertion.  At baseline he has dyspnea on exertion but has noticed significant fatigue.  Also endorses chills.  Denies fevers, chest pain, frank hemoptysis, headaches, dizziness.  Of note, he was initially diagnosis with pulmonary fibrosis in January 2020.  Currently on CellCept and prednisone 10 mg daily from immunosuppression.  Last exacerbation was in May 2020 where he received a short course of antibiotics and steroids.  Has not been hospitalized since January.  However he recently presented to the ED here at [facility] yesterday for left ureteral stone status post left stent placement.  Past Medical History  COPD, AAA, CAD, Psoriatec arthritis, GERD, nephrolithiasis status post recent stent  Significant Hospital Events   7/10 admitted to [facility]  Consults:  PCCM  Procedures:    Significant Diagnostic Tests:  CXR 7/10-reticular bibasilar opacities with possible superimposed infiltrate CTA 7/8-emphysematous changes with peripheral reticular opacities Micro Data:  COVID-19 7/10 - neg BCX 7/10 UCx 7/10  Antimicrobials:   Cefepime 7/10>>  Interim history/subjective:  Looking and feeling better today. 2L negative with lasix challenge.  Asking about when he can have his urological procedure.  Objective   Blood pressure (!) 162/85, pulse 98, temperature 97.8 F (36.6 C), temperature source Oral, resp. rate 17, height 6' (1.829 m), weight 106.1 kg, SpO2 93 %.        Intake/Output Summary (Last 24 hours) at 01/09/2019 0746 Last data filed at 01/09/2019 0600 Gross per 24 hour  Intake -  Output 2400 ml  Net -2400 ml   Filed Weights   01/20/2019 2127  Weight: 106.1 kg    Physical Exam: GEN: elderly man in NAD HEENT: MMM, no thrush CV: Irregular, tachycardic, ext warm PULM: Crackles bases, otherwise clear GI: Soft, +BS EXT: No edema NEURO: moves all 4 ext to command, raspy voice noted PSYCH: AOx3, poor insight SKIN: No rashes   Resolved Hospital Problem list     Assessment & Plan:  # Acute on chronic hypoxemic respiratory failure: seems most c/w ILD flare.  Procal neg.  Some element of fluid overload possible as well.  Underlying exact diagnosis unclear but on DMARDS as OP. # L kidney stone s/p stenting, R atrophied kidney- OP f/u planned # CKD: stable # Hemoptysis: resolved, related to ILD flare # Afib: ?new diagnosis, probably warrants AC once urological issues are dealt with  - Steroid taper as ordered - Switch cefepime to Z pack for anti-inflammatory effect - OOB to chair, walk if able - Wean O2 to maintain sats >88%, near home O2 needs - Another dose of IV lasix today, consider standing PO dose -  Start low dose BB, 2g mag, titrate to HR < 110 (provided pain and anxiety are under control) - Consider OP initiation of AC once urological procedures are finished  Myrla Halstedan Hunter Pinkard MD PCCM

## 2019-01-09 NOTE — Progress Notes (Signed)
CRITICAL VALUE ALERT  Critical Value:  Troponin 188.0  Date & Time Notied:  01/09/19 @ 1435  Provider Notified: Dr. Smith----01/09/19 @1440    Orders Received/Actions taken: None at this time

## 2019-01-09 NOTE — Progress Notes (Addendum)
Patient restless then found to be unresponsive with agonal respirations for a few moments.  Looked like some SVT on monitor.  Settled out after he was positioned in bed and given metoprolol.  Instituting amiodarone, checking EKG, and cycling trops.  Patient now awake and talking.   Update: initial EKG benign for ischemia but does show prolonged QTc, will give more Mag, hold on further amiodarone and use metoprolol with PRN IV pushes with dilt drip if refractory.  Stop azithromycin, use doxycycline instead.  Patient currently going in and out of sinus rhythm.  In speaking with family, patient had similar issue with afib last time he was hospitalized at [facility]  also due to lung issues.

## 2019-01-10 DIAGNOSIS — R042 Hemoptysis: Secondary | ICD-10-CM

## 2019-01-10 LAB — BASIC METABOLIC PANEL
Anion gap: 14 (ref 5–15)
BUN: 35 mg/dL — ABNORMAL HIGH (ref 8–23)
CO2: 23 mmol/L (ref 22–32)
Calcium: 9 mg/dL (ref 8.9–10.3)
Chloride: 101 mmol/L (ref 98–111)
Creatinine, Ser: 2.01 mg/dL — ABNORMAL HIGH (ref 0.61–1.24)
GFR calc Af Amer: 34 mL/min — ABNORMAL LOW (ref >60)
GFR calc non Af Amer: 29 mL/min — ABNORMAL LOW (ref >60)
Glucose, Bld: 120 mg/dL — ABNORMAL HIGH (ref 70–99)
Potassium: 4.2 mmol/L (ref 3.5–5.1)
Sodium: 138 mmol/L (ref 135–145)

## 2019-01-10 LAB — MAGNESIUM: Magnesium: 2.4 mg/dL (ref 1.7–2.4)

## 2019-01-10 LAB — PROCALCITONIN: Procalcitonin: <0.1 ng/mL

## 2019-01-10 MED ORDER — LORAZEPAM 2 MG/ML IJ SOLN
2.0000 mg | INTRAMUSCULAR | Status: DC | PRN
  Administered 2019-01-10 – 2019-01-12 (×6): 2 mg via INTRAVENOUS
  Filled 2019-01-10 (×6): qty 1

## 2019-01-10 MED ORDER — HALOPERIDOL LACTATE 5 MG/ML IJ SOLN
2.0000 mg | Freq: Four times a day (QID) | INTRAMUSCULAR | Status: DC | PRN
  Administered 2019-01-10 – 2019-01-11 (×3): 2 mg via INTRAVENOUS
  Filled 2019-01-10 (×3): qty 1

## 2019-01-10 MED ORDER — HYDRALAZINE HCL 20 MG/ML IJ SOLN
10.0000 mg | Freq: Four times a day (QID) | INTRAMUSCULAR | Status: AC | PRN
  Administered 2019-01-10 (×2): 10 mg via INTRAVENOUS
  Filled 2019-01-10 (×2): qty 1

## 2019-01-10 MED ORDER — RAMELTEON 8 MG PO TABS
8.0000 mg | ORAL_TABLET | Freq: Every day | ORAL | Status: DC
  Filled 2019-01-10 (×4): qty 1

## 2019-01-10 MED ORDER — METOPROLOL SUCCINATE ER 25 MG PO TB24: 50.0000 mg | ORAL_TABLET | Freq: Every day | ORAL | Status: DC

## 2019-01-10 MED ORDER — PREDNISONE 10 MG PO TABS: 10.0000 mg | ORAL_TABLET | Freq: Every day | ORAL | Status: DC

## 2019-01-10 MED ORDER — PREDNISONE 20 MG PO TABS: 20.0000 mg | ORAL_TABLET | Freq: Every day | ORAL | Status: DC

## 2019-01-10 MED ORDER — DIGOXIN 0.25 MG/ML IJ SOLN
0.5000 mg | Freq: Once | INTRAMUSCULAR | Status: AC
  Administered 2019-01-10: 0.5 mg via INTRAVENOUS
  Filled 2019-01-10: qty 2

## 2019-01-10 MED ORDER — HYDRALAZINE HCL 20 MG/ML IJ SOLN
10.0000 mg | Freq: Once | INTRAMUSCULAR | Status: AC
  Administered 2019-01-10: 10 mg via INTRAVENOUS
  Filled 2019-01-10: qty 1

## 2019-01-10 MED ORDER — HYDROMORPHONE HCL 1 MG/ML IJ SOLN
0.5000 mg | Freq: Once | INTRAMUSCULAR | Status: AC
  Administered 2019-01-10: 0.5 mg via INTRAVENOUS
  Filled 2019-01-10: qty 1

## 2019-01-10 MED ORDER — LORAZEPAM 2 MG/ML IJ SOLN
1.0000 mg | INTRAMUSCULAR | Status: DC | PRN
  Administered 2019-01-10 (×3): 1 mg via INTRAVENOUS
  Filled 2019-01-10 (×3): qty 1

## 2019-01-10 MED ORDER — METOPROLOL TARTRATE 5 MG/5ML IV SOLN
5.0000 mg | Freq: Once | INTRAVENOUS | Status: AC
  Administered 2019-01-10: 21:00:00 5 mg via INTRAVENOUS

## 2019-01-10 MED ORDER — LORAZEPAM 2 MG/ML IJ SOLN
1.0000 mg | Freq: Once | INTRAMUSCULAR | Status: AC
  Administered 2019-01-10: 03:00:00 1 mg via INTRAVENOUS
  Filled 2019-01-10: qty 1

## 2019-01-10 MED ORDER — HYDROMORPHONE HCL 1 MG/ML IJ SOLN
0.2000 mg | INTRAMUSCULAR | Status: DC | PRN
  Administered 2019-01-10 – 2019-01-11 (×2): 0.2 mg via INTRAVENOUS
  Filled 2019-01-10 (×2): qty 1

## 2019-01-10 NOTE — Progress Notes (Addendum)
Pt's HR sustained in the 140's-160's, [facility] NP Schorr paged.  0.5 Digoxin ordered. 0.5 Digoxin given. Pt's HR remains 130's-150's.  [facility] paged again.  Awaiting orders.  Will continue to closely monitor patient.  Mariann Laster RN

## 2019-01-10 NOTE — Progress Notes (Signed)
NAME:  Zyion Doxtater., MRN:  [id number], DOB:  04/02/1932, LOS: 2 ADMISSION DATE:  01/27/2019, CONSULTATION DATE:  01/24/2019 REFERRING MD:  Horris Latino, MD CHIEF COMPLAINT:  Shortness of breath, hemoptysis  Brief History   Mr. Tutterow Junior is a 85 year old male with pulmonary fibrosis on CellCept and steroids who presents with chills, shortness of breath and increased home oxygen requirement.  PCCM consulted for small volume hemoptysis.  History of present illness   Mr. Jerline Pain Junior is a 85 year old male with pulmonary fibrosis on chronic immunosuppression (CellCept and prednisone 10 mg daily), chronic hypoxemic respiratory failure 3 to 4 L via nasal cannula who presents with shortness of breath and 1 day history of light pink sputum.  He has noticed worsening shortness of breath in the last week and has asked his wife to increase his oxygen from 2 to 3L to 4 to 6 L on exertion.  At baseline he has dyspnea on exertion but has noticed significant fatigue.  Also endorses chills.  Denies fevers, chest pain, frank hemoptysis, headaches, dizziness.  Of note, he was initially diagnosis with pulmonary fibrosis in January 2020.  Currently on CellCept and prednisone 10 mg daily from immunosuppression.  Last exacerbation was in May 2020 where he received a short course of antibiotics and steroids.  Has not been hospitalized since January.  However he recently presented to the ED here at [facility] yesterday for left ureteral stone status post left stent placement.  Past Medical History  COPD, AAA, CAD, Psoriatec arthritis, GERD, nephrolithiasis status post recent stent  Significant Hospital Events   7/10 admitted to [facility]  Consults:  PCCM  Procedures:    Significant Diagnostic Tests:  CXR 7/10-reticular bibasilar opacities with possible superimposed infiltrate CTA 7/8-emphysematous changes with peripheral reticular opacities Micro Data:  COVID-19 7/10 - neg BCX 7/10 UCx 7/10  Antimicrobials:   Cefepime 7/10>>  Interim history/subjective:  Now sundowning.  Wife states this happens when he is in unfamiliar environments.  He denies pain or SOB.  Objective   Blood pressure (!) 169/84, pulse (!) 106, temperature 97.6 F (36.4 C), temperature source Oral, resp. rate 19, height 6' (1.829 m), weight 106.1 kg, SpO2 96 %.        Intake/Output Summary (Last 24 hours) at 01/10/2019 0858 Last data filed at 01/10/2019 0973 Gross per 24 hour  Intake 345.97 ml  Output 1920 ml  Net -1574.03 ml   Filed Weights   01/11/2019 2127  Weight: 106.1 kg    Physical Exam: GEN: elderly man in NAD HEENT: MMM, no thrush CV: Irregular, tachycardic, ext warm PULM: Crackles bases, otherwise clear GI: Soft, +BS EXT: No edema NEURO: moves all 4 ext to command, raspy voice noted PSYCH: Tangential, oriented to self only SKIN: No rashes   Resolved Hospital Problem list     Assessment & Plan:  # Acute on chronic hypoxemic respiratory failure: seems most c/w ILD flare.  Procal neg.  Some element of fluid overload possible as well.  Underlying exact diagnosis unclear ?psoriasis-related but on DMARDS as OP. # L kidney stone s/p stenting, R atrophied kidney- OP f/u planned # CKD: a little worse today # Hemoptysis: resolved, related to ILD flare # Afib: recurrent paroxysmal, should be on [facility] once urological procedures completed. # Delirium: better when family is around but still present.  Steroids not helping.  - Going to move up steroid taper to lower doses as his delirium is more of a barrier to getting better than breathing -  Finish out course of doxycycline as ordered - Continue PTA cellcept - Emphasize day/night cycles, haldol as ordered by primary, will add some qHS ramelteon, family at bedside would help too - Wean O2 to maintain sats >88%, near home O2 needs - Hold all lasix today, consider sending home on 20mg /day - Increase toprol today, delirium probably driving RVR more than anything else  - Consider OP initiation of AC once urological procedures are finished - Have him see his primary pulmonologist within 2-4 weeks of discharge - As he is near home O2 requirements will sign off, please call if can be of further help  Myrla Halstedan Ettamae Barkett MD PCCM

## 2019-01-10 NOTE — Progress Notes (Signed)
Patient's wife visited hospital, I was able to talk to her at bedside.  Patient still remains agitated and restless, wanting to get out of the bed.  Heart rate is 110-120.  Blood pressures are stable.  He is on 4 L oxygen. Patient has developed delirium and agitation.  Patient's wife reported that he drinks 3-4 drinks a day of hard liquor, 3 to 4 days a week. His delirium is probably multifactorial, however may have component of alcohol withdrawal.  Will put patient on a higher dose of Ativan with close monitoring.  Continue soft restraints to avoid trauma and fall as well as to keep patient safe.
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
## 2019-01-10 NOTE — Progress Notes (Signed)
PROGRESS NOTE    Tanner Cox.  VHQ:[id number] DOB: 1932-04-05 DOA: 01/23/2019 PCP: System, Pcp Not In    Brief Narrative:   85 year old gentleman with multiple comorbidities including history of hypertension, COPD with pulmonary fibrosis, chronic hypoxic failure on 3 L of oxygen at home, AAA, CAD, nephrolithiasis and recent left ureteric stent placement and planned staged procedure by urology, psoriatic arthritis, GERD who presented to the emergency room with complaints of worsening shortness of breath, productive cough and one episode of blood-tinged sputum after discharge from hospital 1 day prior for ureteric stent placement.  Patient was found to have exacerbation of underlying interstitial lung disease and is admitted to the hospital for treatment.  Followed by pulmonology.  COVID-19 negative.  Initially required 5 L oxygen and now improving. 01/09/2019: Patient has developed A. fib with RVR.  Apparently has history of paroxysmal A. fib.  Echocardiogram was normal. 01/10/2019: Patient has developed delirium, agitation requiring restraints and sitter.  Patient does have history of paranoid episodes.  Assessment & Plan:   Active Problems:   Left nephrolithiasis   Cough with hemoptysis   PAD (peripheral artery disease) (HCC)   COPD (chronic obstructive pulmonary disease) (HCC)   Psoriatic arthritis (HCC)   Pulmonary fibrosis (HCC)   Hypertension  Acute on chronic hypoxic respiratory failure, ILD exacerbation with small volume hemoptysis: Aggressive bronchodilator therapy, IV steroids and tapered to oral. deep breathing exercises, incentive spirometry, chest physiotherapy and respiratory therapy consult.  Some response to diuretics.  Echocardiogram was normal.  Doxycycline for 5 days. Supplemental oxygen to keep saturations more than 92%. Appreciate pulmonology input.  Interstitial lung disease: Has advanced interstitial lung disease.  Currently remains on disease modifying agent  with CellCept and prednisone.  Started on high-dose prednisone with hope to resume his chronic prednisone.  Follows up with pulmonology outpatient.  Stage III CKD due to hypertension: Slight worsening with diuresis.  Stopping dialysis today.  Paroxysmal A. fib with RVR: Worsened with hypoxia and agitation.  Treated with increasing dose of metoprolol.  Try to control rate.  He will benefit with anticoagulation, he is a scheduled for urological procedure.  We will follow-up outpatient with cardiology.  Sundowning/delirium: Patient developed delirium in the hospital.  Difficult to control symptoms.  On soft restraints.  Will start patient on Haldol and Ativan.  Will allow family to stay in the hospital.  Hypertension: Blood pressures are stable.   GERD: On PPI.  Continue.  DVT prophylaxis: SCDs Code Status: Full code Family Communication: None, wife and daughters updated at bedside on 01/09/2019.  Will meet with patient's wife. Disposition Plan: Inpatient, stepdown unit today.   Consultants:   PCCM, signed off  Procedures:   None  Antimicrobials:   Cefepime, 01/23/2019-01/09/2019  Azithromycin, 01/09/2019--- 01/09/2019  Doxycycline, 01/09/2019---   Subjective: Patient was seen and examined at the bedside.  Overnight events noted.  Patient became agitated, impulsive and abusive to the staff.  Heart rate goes up with agitation and trying to get out of the bed.  Needed soft restraint to protect from fall and to protect the IV lines.  Objective: Vitals:   01/10/19 0450 01/10/19 0540 01/10/19 0606 01/10/19 0800  BP:  (!) 163/121 (!) 169/84 (!) 182/91  Pulse:    (!) 119  Resp:  (!) 26 19 (!) 23  Temp: 97.6 F (36.4 C)   97.6 F (36.4 C)  TempSrc: Oral   Oral  SpO2:    92%  Weight:      Height:  Intake/Output Summary (Last 24 hours) at 01/10/2019 0959 Last data filed at 01/10/2019 0657 Gross per 24 hour  Intake 345.97 ml  Output 1120 ml  Net -774.03 ml   Filed Weights    01/27/2019 2127  Weight: 106.1 kg    Examination:  General exam: Appears anxious and agitated.  Currently on 3 L of oxygen.   Respiratory system: Mostly bilateral conducted airway sounds.  Occasional expiratory wheezes.  Fine crackles at bases.   Cardiovascular system: S1 & S2 heard, irregular and tachycardic . No JVD, murmurs, rubs, gallops or clicks. No pedal edema. Gastrointestinal system: Abdomen is nondistended, soft and nontender. No organomegaly or masses felt. Normal bowel sounds heard.  Obese and pendulous. Central nervous system: Alert and awake.  Impulsive and paranoid.  No focal neurological deficits. Extremities: Symmetric 5 x 5 power. Skin: No rashes, lesions or ulcers Psychiatry: Judgement and insight appear altered.  Mood & affect flat and anxious.    Data Reviewed: I have personally reviewed following labs and imaging studies  CBC: Recent Labs  Lab 01/06/19 0220 01/07/19 0510 01/09/2019 1157 01/09/19 0315  WBC 8.7 4.7 8.5 4.5  NEUTROABS  --   --  7.0  --   HGB 13.1 11.1* 10.2* 9.8*  HCT 43.7 37.5* 32.8* 32.4*  MCV 101.6* 103.9* 101.2* 101.6*  PLT 196 170 168 158   Basic Metabolic Panel: Recent Labs  Lab 01/06/19 0220 01/07/19 0510 01/09/2019 1157 01/09/19 0315 01/10/19 0256  NA 139 141 139 140 138  K 3.8 3.9 3.6 3.8 4.2  CL 104 106 105 103 101  CO2 25 23 22 25 23   GLUCOSE 146* 136* 117* 138* 120*  BUN 25* 27* 28* 25* 35*  CREATININE 1.74* 1.91* 1.79* 1.78* 2.01*  CALCIUM 9.8 8.9 9.0 8.5* 9.0  MG  --   --   --  1.9 2.4   GFR: Estimated Creatinine Clearance: 33.8 mL/min (A) (by C-G formula based on SCr of 2.01 mg/dL (H)). Liver Function Tests: Recent Labs  Lab 01/06/19 0220 01/27/2019 1157  AST 23 25  ALT 23 20  ALKPHOS 43 31*  BILITOT 0.6 0.5  PROT 8.1 6.4*  ALBUMIN 4.4 3.6   Recent Labs  Lab 01/06/19 0220  LIPASE 26   No results for input(s): AMMONIA in the last 168 hours. Coagulation Profile: No results for input(s): INR, PROTIME in  the last 168 hours. Cardiac Enzymes: No results for input(s): CKTOTAL, CKMB, CKMBINDEX, TROPONINI in the last 168 hours. BNP (last 3 results) No results for input(s): PROBNP in the last 8760 hours. HbA1C: No results for input(s): HGBA1C in the last 72 hours. CBG: Recent Labs  Lab 01/09/19 0742  GLUCAP 150*   Lipid Profile: No results for input(s): CHOL, HDL, LDLCALC, TRIG, CHOLHDL, LDLDIRECT in the last 72 hours. Thyroid Function Tests: Recent Labs    01/09/19 1342  TSH 0.538   Anemia Panel: No results for input(s): VITAMINB12, FOLATE, FERRITIN, TIBC, IRON, RETICCTPCT in the last 72 hours. Sepsis Labs: Recent Labs  Lab 01/06/19 0606 01/02/2019 1157 01/29/2019 2047 01/09/19 0315 01/10/19 0256  PROCALCITON  --   --  <0.10 <0.10 <0.10  LATICACIDVEN 1.5 0.9  --   --   --     Recent Results (from the past 240 hour(s))  SARS Coronavirus 2 (CEPHEID - Performed in [facility] hospital lab), Hosp Order     Status: None   Collection Time: 01/06/19  3:18 AM   Specimen: Nasopharyngeal Swab  Result Value Ref Range  Status   SARS Coronavirus 2 NEGATIVE NEGATIVE Final    Comment: (NOTE) If result is NEGATIVE SARS-CoV-2 target nucleic acids are NOT DETECTED. The SARS-CoV-2 RNA is generally detectable in upper and lower  respiratory specimens during the acute phase of infection. The lowest  concentration of SARS-CoV-2 viral copies this assay can detect is 250  copies / mL. A negative result does not preclude SARS-CoV-2 infection  and should not be used as the sole basis for treatment or other  patient management decisions.  A negative result may occur with  improper specimen collection / handling, submission of specimen other  than nasopharyngeal swab, presence of viral mutation(s) within the  areas targeted by this assay, and inadequate number of viral copies  (<250 copies / mL). A negative result must be combined with clinical  observations, patient history, and epidemiological  information. If result is POSITIVE SARS-CoV-2 target nucleic acids are DETECTED. The SARS-CoV-2 RNA is generally detectable in upper and lower  respiratory specimens dur ing the acute phase of infection.  Positive  results are indicative of active infection with SARS-CoV-2.  Clinical  correlation with patient history and other diagnostic information is  necessary to determine patient infection status.  Positive results do  not rule out bacterial infection or co-infection with other viruses. If result is PRESUMPTIVE POSTIVE SARS-CoV-2 nucleic acids MAY BE PRESENT.   A presumptive positive result was obtained on the submitted specimen  and confirmed on repeat testing.  While 2019 novel coronavirus  (SARS-CoV-2) nucleic acids may be present in the submitted sample  additional confirmatory testing may be necessary for epidemiological  and / or clinical management purposes  to differentiate between  SARS-CoV-2 and other Sarbecovirus currently known to infect humans.  If clinically indicated additional testing with an alternate test  methodology [phone]([id number]) is advised. The SARS-CoV-2 RNA is generally  detectable in upper and lower respiratory sp ecimens during the acute  phase of infection. The expected result is Negative. Fact Sheet for Patients:  BoilerBrush.com.cyhttps://www.fda.gov/media/136312/download Fact Sheet for Healthcare Providers: https://pope.com/https://www.fda.gov/media/136313/download This test is not yet approved or cleared by the [place] FDA and has been authorized for detection and/or diagnosis of SARS-CoV-2 by FDA under an Emergency Use Authorization (EUA).  This EUA will remain in effect (meaning this test can be used) for the duration of the COVID-19 declaration under Section 564(b)(1) of the Act, 21 U.S.C. section 360bbb-3(b)(1), unless the authorization is terminated or revoked sooner. Performed at [facility], [address] [place], [place] [address]   MRSA PCR  Screening     Status: None   Collection Time: 01/06/19  1:44 PM   Specimen: Nasal Mucosa; Nasopharyngeal  Result Value Ref Range Status   MRSA by PCR NEGATIVE NEGATIVE Final    Comment:        The GeneXpert MRSA Assay (FDA approved for NASAL specimens only), is one component of a comprehensive MRSA colonization surveillance program. It is not intended to diagnose MRSA infection nor to guide or monitor treatment for MRSA infections. Performed at [facility], [address] [place], [place] [address]   Culture, blood (Routine X 2) w Reflex to ID Panel     Status: None (Preliminary result)   Collection Time: 01/15/2019 11:57 AM   Specimen: BLOOD  Result Value Ref Range Status   Specimen Description   Final    BLOOD RIGHT ARM Performed at [facility], [address] [place], [place] [address]  Special Requests   Final    BOTTLES DRAWN AEROBIC AND ANAEROBIC Blood Culture adequate volume Performed at [facility], [address], [place], [place] [address]    Culture   Final    NO GROWTH 1 DAY Performed at [facility], [address], [place], [place] [address]    Report Status PENDING  Incomplete  Urine Culture     Status: None   Collection Time: 01/05/2019 12:57 PM   Specimen: Urine, Random  Result Value Ref Range Status   Specimen Description   Final    URINE, RANDOM Performed at [facility], [address], [place], [place] [address]    Special Requests   Final    NONE Performed at [facility], [address] [place], [place] [address]    Culture   Final    NO GROWTH Performed at [facility], [address] [place], [place] [address]    Report Status 01/09/2019 FINAL  Final  Culture, blood (Routine X 2) w Reflex to ID Panel     Status: None (Preliminary result)   Collection Time: 01/19/2019 12:57 PM   Specimen: BLOOD  Result Value Ref Range Status    Specimen Description   Final    BLOOD RIGHT ANTECUBITAL Performed at [facility], [address], [place], [place] [address]    Special Requests   Final    BOTTLES DRAWN AEROBIC ONLY Blood Culture results may not be optimal due to an inadequate volume of blood received in culture bottles Performed at [facility], [address], [place], [place] [address]    Culture   Final    NO GROWTH < 24 HOURS Performed at [facility], [address] [place], [place] [address]    Report Status PENDING  Incomplete  SARS Coronavirus 2 (CEPHEID- Performed in [facility] hospital lab), Hosp Order     Status: None   Collection Time: 01/12/2019 12:57 PM   Specimen: Nasopharyngeal Swab  Result Value Ref Range Status   SARS Coronavirus 2 NEGATIVE NEGATIVE Final    Comment: (NOTE) If result is NEGATIVE SARS-CoV-2 target nucleic acids are NOT DETECTED. The SARS-CoV-2 RNA is generally detectable in upper and lower  respiratory specimens during the acute phase of infection. The lowest  concentration of SARS-CoV-2 viral copies this assay can detect is 250  copies / mL. A negative result does not preclude SARS-CoV-2 infection  and should not be used as the sole basis for treatment or other  patient management decisions.  A negative result may occur with  improper specimen collection / handling, submission of specimen other  than nasopharyngeal swab, presence of viral mutation(s) within the  areas targeted by this assay, and inadequate number of viral copies  (<250 copies / mL). A negative result must be combined with clinical  observations, patient history, and epidemiological information. If result is POSITIVE SARS-CoV-2 target nucleic acids are DETECTED. The SARS-CoV-2 RNA is generally detectable in upper and lower  respiratory specimens dur ing the acute phase of infection.  Positive  results are indicative of active infection with SARS-CoV-2.  Clinical   correlation with patient history and other diagnostic information is  necessary to determine patient infection status.  Positive results do  not rule out bacterial infection or co-infection with other viruses. If result is PRESUMPTIVE POSTIVE SARS-CoV-2 nucleic acids MAY BE PRESENT.   A presumptive positive result was obtained on  the submitted specimen  and confirmed on repeat testing.  While 2019 novel coronavirus  (SARS-CoV-2) nucleic acids may be present in the submitted sample  additional confirmatory testing may be necessary for epidemiological  and / or clinical management purposes  to differentiate between  SARS-CoV-2 and other Sarbecovirus currently known to infect humans.  If clinically indicated additional testing with an alternate test  methodology [phone]) is advised. The SARS-CoV-2 RNA is generally  detectable in upper and lower respiratory sp ecimens during the acute  phase of infection. The expected result is Negative. Fact Sheet for Patients:  StrictlyIdeas.no Fact Sheet for Healthcare Providers: BankingDealers.co.za This test is not yet approved or cleared by the [place] FDA and has been authorized for detection and/or diagnosis of SARS-CoV-2 by FDA under an Emergency Use Authorization (EUA).  This EUA will remain in effect (meaning this test can be used) for the duration of the COVID-19 declaration under Section 564(b)(1) of the Act, 21 U.S.C. section 360bbb-3(b)(1), unless the authorization is terminated or revoked sooner. Performed at [facility], [place] [address], [place], [place] [address]   MRSA PCR Screening     Status: None   Collection Time: 01/09/19  1:30 AM   Specimen: Nasal Mucosa; Nasopharyngeal  Result Value Ref Range Status   MRSA by PCR NEGATIVE NEGATIVE Final    Comment:        The GeneXpert MRSA Assay (FDA approved for NASAL specimens only), is one component of a  comprehensive MRSA colonization surveillance program. It is not intended to diagnose MRSA infection nor to guide or monitor treatment for MRSA infections. Performed at [facility], [place] [address] [place], [place] [address]          Radiology Studies: Dg Chest 2 View  Result Date: 01/25/2019 CLINICAL DATA:  Shortness of breath. EXAM: CHEST - 2 VIEW COMPARISON:  CT scan of January 06, 2019. FINDINGS: Mild cardiomegaly is noted. No pneumothorax is noted. Mild bibasilar reticular densities are noted which may represent fibrotic change, but superimposed acute inflammation or edema cannot be excluded. No significant pleural effusion is noted. Bony thorax is unremarkable. IMPRESSION: Mild bibasilar reticular densities are noted which may represent fibrotic change, but superimposed acute inflammation or edema cannot be excluded. Electronically Signed   By: Marijo Conception M.D.   On: 01/06/2019 11:54        Scheduled Meds: . Chlorhexidine Gluconate Cloth  6 each Topical Daily  . doxycycline  100 mg Oral Q12H  . folic acid  1 mg Oral Daily  . ipratropium  0.5 mg Nebulization TID  . levalbuterol  1.25 mg Nebulization TID  . loratadine  10 mg Oral Daily  . mouth rinse  15 mL Mouth Rinse BID  . metoprolol succinate  12.5 mg Oral Once  . metoprolol succinate  50 mg Oral Daily  . mycophenolate  1,000 mg Oral BID  . pantoprazole  40 mg Oral Daily  . polyethylene glycol  17 g Oral Daily  . predniSONE  20 mg Oral Q breakfast   Followed by  . [START ON 01/13/2019] predniSONE  10 mg Oral Q breakfast  . ramelteon  8 mg Oral QHS  . senna-docusate  2 tablet Oral BID  . thiamine  100 mg Oral Daily   Continuous Infusions: . sodium chloride Stopped (01/09/19 1537)  . diltiazem (CARDIZEM) infusion Stopped (01/09/19 1518)     LOS: 2 days    Time spent: 25 minutes  Dorcas CarrowKuber Fareeha Evon, MD [facility] Pager [phone]  If 7PM-7AM, please contact night-coverage  [email or web address] Password [email or web address] 01/10/2019, 9:59 AM

## 2019-01-11 LAB — BASIC METABOLIC PANEL
Anion gap: 13 (ref 5–15)
BUN: 35 mg/dL — ABNORMAL HIGH (ref 8–23)
CO2: 23 mmol/L (ref 22–32)
Calcium: 9.1 mg/dL (ref 8.9–10.3)
Chloride: 105 mmol/L (ref 98–111)
Creatinine, Ser: 1.75 mg/dL — ABNORMAL HIGH (ref 0.61–1.24)
GFR calc Af Amer: 40 mL/min — ABNORMAL LOW (ref >60)
GFR calc non Af Amer: 35 mL/min — ABNORMAL LOW (ref >60)
Glucose, Bld: 126 mg/dL — ABNORMAL HIGH (ref 70–99)
Potassium: 4.4 mmol/L (ref 3.5–5.1)
Sodium: 141 mmol/L (ref 135–145)

## 2019-01-11 LAB — CBC WITH DIFFERENTIAL/PLATELET
Abs Immature Granulocytes: 0.07 10*3/uL (ref 0.00–0.07)
Basophils Absolute: 0 10*3/uL (ref 0.0–0.1)
Basophils Relative: 0 %
Eosinophils Absolute: 0 10*3/uL (ref 0.0–0.5)
Eosinophils Relative: 0 %
HCT: 37.8 % — ABNORMAL LOW (ref 39.0–52.0)
Hemoglobin: 11.3 g/dL — ABNORMAL LOW (ref 13.0–17.0)
Immature Granulocytes: 1 %
Lymphocytes Relative: 4 %
Lymphs Abs: 0.6 10*3/uL — ABNORMAL LOW (ref 0.7–4.0)
MCH: 30 pg (ref 26.0–34.0)
MCHC: 29.9 g/dL — ABNORMAL LOW (ref 30.0–36.0)
MCV: 100.3 fL — ABNORMAL HIGH (ref 80.0–100.0)
Monocytes Absolute: 1.6 10*3/uL — ABNORMAL HIGH (ref 0.1–1.0)
Monocytes Relative: 12 %
Neutro Abs: 10.9 10*3/uL — ABNORMAL HIGH (ref 1.7–7.7)
Neutrophils Relative %: 83 %
Platelets: 188 10*3/uL (ref 150–400)
RBC: 3.77 MIL/uL — ABNORMAL LOW (ref 4.22–5.81)
RDW: 14.1 % (ref 11.5–15.5)
WBC: 13.2 10*3/uL — ABNORMAL HIGH (ref 4.0–10.5)
nRBC: 0 % (ref 0.0–0.2)

## 2019-01-11 LAB — MAGNESIUM: Magnesium: 2.3 mg/dL (ref 1.7–2.4)

## 2019-01-11 MED ORDER — METOPROLOL TARTRATE 5 MG/5ML IV SOLN
5.0000 mg | Freq: Once | INTRAVENOUS | Status: AC
  Administered 2019-01-11: 02:00:00 5 mg via INTRAVENOUS
  Filled 2019-01-11: qty 5

## 2019-01-11 MED ORDER — HYDROMORPHONE HCL 1 MG/ML IJ SOLN
0.2000 mg | INTRAMUSCULAR | Status: AC | PRN
  Administered 2019-01-11 – 2019-01-13 (×3): 0.2 mg via INTRAVENOUS
  Filled 2019-01-11 (×3): qty 1
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
## 2019-01-11 NOTE — Progress Notes (Signed)
PROGRESS NOTE    Tanner LoraPrentice Massie Jr.  RUE:[id number] DOB: 09-May-1932 DOA: 01/14/2019 PCP: System, Pcp Not In    Brief Narrative:   85 year old gentleman with multiple comorbidities including history of hypertension, COPD with pulmonary fibrosis, chronic hypoxic failure on 3 L of oxygen at home, AAA, CAD, nephrolithiasis and recent left ureteric stent placement and planned staged procedure by urology, psoriatic arthritis, GERD who presented to the emergency room with complaints of worsening shortness of breath, productive cough and one episode of blood-tinged sputum after discharge from hospital 1 day prior for ureteric stent placement.  Patient was found to have exacerbation of underlying interstitial lung disease and is admitted to the hospital for treatment.  Followed by pulmonology.  COVID-19 negative.  Initially required 5 L oxygen and now improving. 01/09/2019: Patient has developed A. fib with RVR.  Apparently has history of paroxysmal A. fib.  Echocardiogram was normal. 01/10/2019: Patient has developed delirium, agitation requiring restraints and sitter.  Patient does have history of paranoid episodes.  Continues to be delirious and needing restraints.  Assessment & Plan:   Active Problems:   Left nephrolithiasis   Cough with hemoptysis   PAD (peripheral artery disease) (HCC)   COPD (chronic obstructive pulmonary disease) (HCC)   Psoriatic arthritis (HCC)   Pulmonary fibrosis (HCC)   Hypertension  Acute on chronic hypoxic respiratory failure, ILD exacerbation with small volume hemoptysis: Aggressive bronchodilator therapy, IV steroids and tapered to oral. deep breathing exercises, incentive spirometry, chest physiotherapy and respiratory therapy consult.  Some response to diuretics.  Echocardiogram was normal.  Doxycycline for 5 days. Supplemental oxygen to keep saturations more than 92%. Appreciate pulmonology input.  Interstitial lung disease: Has advanced interstitial lung  disease.  Currently remains on disease modifying agent with CellCept and prednisone.  Started on high-dose prednisone with hope to resume his chronic prednisone.  Follows up with pulmonology outpatient.  Tapering dose of prednisone.  Stage III CKD due to hypertension: Fluctuating levels.  Now trending down to baseline.  Paroxysmal A. fib with RVR: Worsened with hypoxia and agitation.  Treated with increasing dose of metoprolol.  Unreliable oral intake because of agitation and delirium.  Keeping on IV metoprolol.  Cannot have amiodarone because of advanced lung disease. He will benefit with anticoagulation, he is scheduled for urological procedure.  We will follow-up outpatient with cardiology.  Sundowning/delirium: Patient developed delirium in the hospital.  Difficult to control symptoms.  On soft restraints.  On haldol. On ativan with h/o alcoholism. Will allow family to stay in the hospital.  Hypertension: Blood pressures are stable.   GERD: On PPI.  Continue.  DVT prophylaxis: SCDs Code Status: Full code Family Communication: patient's wife. Disposition Plan: Inpatient, stepdown unit today.   Consultants:   PCCM, signed off  Procedures:   None  Antimicrobials:   Cefepime, 01/02/2019-01/09/2019  Azithromycin, 01/09/2019--- 01/09/2019  Doxycycline, 01/09/2019---   Subjective: Patient was seen and examined at the bedside.  Still remains agitated and needing soft restraints to save him from fall and protect IV lines. Remained tachycardic overnight.  No way to give oral medications. Continues to be on as needed metoprolol. Agitated and given iv ativan in morning.   Objective: Vitals:   01/11/19 0742 01/11/19 0826 01/11/19 0900 01/11/19 0948  BP:      Pulse:   (!) 115 (!) 109  Resp:   (!) 23 18  Temp: 98.3 F (36.8 C)     TempSrc: Axillary     SpO2:  94% (!) 89%  95%  Weight:      Height:        Intake/Output Summary (Last 24 hours) at 01/11/2019 0959 Last data filed  at 01/11/2019 0500 Gross per 24 hour  Intake 0 ml  Output 425 ml  Net -425 ml   Filed Weights   01/02/2019 2127 01/11/19 0500  Weight: 106.1 kg 102.9 kg    Examination:  General exam: Appears anxious and agitated and delirium. Currently on 5 L of oxygen.   Respiratory system: Mostly bilateral conducted airway sounds. expiratory wheezes.  Fine crackles at bases.   Cardiovascular system: S1 & S2 heard, irregular and tachycardic . No JVD, murmurs, rubs, gallops or clicks. No pedal edema. Gastrointestinal system: Abdomen is nondistended, soft and nontender. No organomegaly or masses felt. Normal bowel sounds heard.  Obese and pendulous. Central nervous system: Impulsive and paranoid.  No focal neurological deficits. Extremities: Symmetric 5 x 5 power. Skin: No rashes, lesions or ulcers Psychiatry: Judgement and insight appear altered.  Mood & affect flat and anxious.    Data Reviewed: I have personally reviewed following labs and imaging studies  CBC: Recent Labs  Lab 01/06/19 0220 01/07/19 0510 01/22/2019 1157 01/09/19 0315 01/11/19 0219  WBC 8.7 4.7 8.5 4.5 13.2*  NEUTROABS  --   --  7.0  --  10.9*  HGB 13.1 11.1* 10.2* 9.8* 11.3*  HCT 43.7 37.5* 32.8* 32.4* 37.8*  MCV 101.6* 103.9* 101.2* 101.6* 100.3*  PLT 196 170 168 158 188   Basic Metabolic Panel: Recent Labs  Lab 01/07/19 0510 01/20/2019 1157 01/09/19 0315 01/10/19 0256 01/11/19 0219  NA 141 139 140 138 141  K 3.9 3.6 3.8 4.2 4.4  CL 106 105 103 101 105  CO2 23 22 25 23 23   GLUCOSE 136* 117* 138* 120* 126*  BUN 27* 28* 25* 35* 35*  CREATININE 1.91* 1.79* 1.78* 2.01* 1.75*  CALCIUM 8.9 9.0 8.5* 9.0 9.1  MG  --   --  1.9 2.4 2.3   GFR: Estimated Creatinine Clearance: 38.3 mL/min (A) (by C-G formula based on SCr of 1.75 mg/dL (H)). Liver Function Tests: Recent Labs  Lab 01/06/19 0220 01/07/2019 1157  AST 23 25  ALT 23 20  ALKPHOS 43 31*  BILITOT 0.6 0.5  PROT 8.1 6.4*  ALBUMIN 4.4 3.6   Recent Labs   Lab 01/06/19 0220  LIPASE 26   No results for input(s): AMMONIA in the last 168 hours. Coagulation Profile: No results for input(s): INR, PROTIME in the last 168 hours. Cardiac Enzymes: No results for input(s): CKTOTAL, CKMB, CKMBINDEX, TROPONINI in the last 168 hours. BNP (last 3 results) No results for input(s): PROBNP in the last 8760 hours. HbA1C: No results for input(s): HGBA1C in the last 72 hours. CBG: Recent Labs  Lab 01/09/19 0742  GLUCAP 150*   Lipid Profile: No results for input(s): CHOL, HDL, LDLCALC, TRIG, CHOLHDL, LDLDIRECT in the last 72 hours. Thyroid Function Tests: Recent Labs    01/09/19 1342  TSH 0.538   Anemia Panel: No results for input(s): VITAMINB12, FOLATE, FERRITIN, TIBC, IRON, RETICCTPCT in the last 72 hours. Sepsis Labs: Recent Labs  Lab 01/06/19 0606 01/25/2019 1157 01/11/2019 2047 01/09/19 0315 01/10/19 0256  PROCALCITON  --   --  <0.10 <0.10 <0.10  LATICACIDVEN 1.5 0.9  --   --   --     Recent Results (from the past 240 hour(s))  SARS Coronavirus 2 (CEPHEID - Performed in [facility] lab), [facility]  Status: None   Collection Time: 01/06/19  3:18 AM   Specimen: Nasopharyngeal Swab  Result Value Ref Range Status   SARS Coronavirus 2 NEGATIVE NEGATIVE Final    Comment: (NOTE) If result is NEGATIVE SARS-CoV-2 target nucleic acids are NOT DETECTED. The SARS-CoV-2 RNA is generally detectable in upper and lower  respiratory specimens during the acute phase of infection. The lowest  concentration of SARS-CoV-2 viral copies this assay can detect is 250  copies / mL. A negative result does not preclude SARS-CoV-2 infection  and should not be used as the sole basis for treatment or other  patient management decisions.  A negative result may occur with  improper specimen collection / handling, submission of specimen other  than nasopharyngeal swab, presence of viral mutation(s) within the  areas targeted by this assay, and  inadequate number of viral copies  (<250 copies / mL). A negative result must be combined with clinical  observations, patient history, and epidemiological information. If result is POSITIVE SARS-CoV-2 target nucleic acids are DETECTED. The SARS-CoV-2 RNA is generally detectable in upper and lower  respiratory specimens dur ing the acute phase of infection.  Positive  results are indicative of active infection with SARS-CoV-2.  Clinical  correlation with patient history and other diagnostic information is  necessary to determine patient infection status.  Positive results do  not rule out bacterial infection or co-infection with other viruses. If result is PRESUMPTIVE POSTIVE SARS-CoV-2 nucleic acids MAY BE PRESENT.   A presumptive positive result was obtained on the submitted specimen  and confirmed on repeat testing.  While 2019 novel coronavirus  (SARS-CoV-2) nucleic acids may be present in the submitted sample  additional confirmatory testing may be necessary for epidemiological  and / or clinical management purposes  to differentiate between  SARS-CoV-2 and other Sarbecovirus currently known to infect humans.  If clinically indicated additional testing with an alternate test  methodology [phone]) is advised. The SARS-CoV-2 RNA is generally  detectable in upper and lower respiratory sp ecimens during the acute  phase of infection. The expected result is Negative. Fact Sheet for Patients:  StrictlyIdeas.no Fact Sheet for Healthcare Providers: BankingDealers.co.za This test is not yet approved or cleared by the [place] FDA and has been authorized for detection and/or diagnosis of SARS-CoV-2 by FDA under an Emergency Use Authorization (EUA).  This EUA will remain in effect (meaning this test can be used) for the duration of the COVID-19 declaration under Section 564(b)(1) of the Act, 21 U.S.C. section 360bbb-3(b)(1), unless the  authorization is terminated or revoked sooner. Performed at [facility], [place] [address], [place], [place] [address]   MRSA PCR Screening     Status: None   Collection Time: 01/06/19  1:44 PM   Specimen: Nasal Mucosa; Nasopharyngeal  Result Value Ref Range Status   MRSA by PCR NEGATIVE NEGATIVE Final    Comment:        The GeneXpert MRSA Assay (FDA approved for NASAL specimens only), is one component of a comprehensive MRSA colonization surveillance program. It is not intended to diagnose MRSA infection nor to guide or monitor treatment for MRSA infections. Performed at [facility], [place] [address] [place], [place] [address]   Culture, blood (Routine X 2) w Reflex to ID Panel     Status: None (Preliminary result)   Collection Time: 2019-01-27 11:57 AM   Specimen: BLOOD  Result Value Ref Range Status   Specimen Description   Final  BLOOD RIGHT ARM Performed at [facility], [address], [place], [place] [address]    Special Requests   Final    BOTTLES DRAWN AEROBIC AND ANAEROBIC Blood Culture adequate volume Performed at [facility], [address], [place], [place] [address]    Culture   Final    NO GROWTH 2 DAYS Performed at [facility], [address], [place], [place] [address]    Report Status PENDING  Incomplete  Urine Culture     Status: None   Collection Time: 01/25/2019 12:57 PM   Specimen: Urine, Random  Result Value Ref Range Status   Specimen Description   Final    URINE, RANDOM Performed at [facility], [address], [place], [place] [address]    Special Requests   Final    NONE Performed at [facility], [address], [place], [place] [address]    Culture   Final    NO GROWTH Performed at [facility], [address], [place], [place] [address]    Report Status 01/09/2019 FINAL  Final  Culture, blood (Routine X 2) w  Reflex to ID Panel     Status: None (Preliminary result)   Collection Time: 01/04/2019 12:57 PM   Specimen: BLOOD  Result Value Ref Range Status   Specimen Description   Final    BLOOD RIGHT ANTECUBITAL Performed at [facility], [address] [place], [place] [address]    Special Requests   Final    BOTTLES DRAWN AEROBIC ONLY Blood Culture results may not be optimal due to an inadequate volume of blood received in culture bottles Performed at [facility], [address] [place], [place] [address]    Culture   Final    NO GROWTH 2 DAYS Performed at [facility], [address] [place], [place] [address]    Report Status PENDING  Incomplete  SARS Coronavirus 2 (CEPHEID- Performed in [facility] hospital lab), Hosp Order     Status: None   Collection Time: 01/19/2019 12:57 PM   Specimen: Nasopharyngeal Swab  Result Value Ref Range Status   SARS Coronavirus 2 NEGATIVE NEGATIVE Final    Comment: (NOTE) If result is NEGATIVE SARS-CoV-2 target nucleic acids are NOT DETECTED. The SARS-CoV-2 RNA is generally detectable in upper and lower  respiratory specimens during the acute phase of infection. The lowest  concentration of SARS-CoV-2 viral copies this assay can detect is 250  copies / mL. A negative result does not preclude SARS-CoV-2 infection  and should not be used as the sole basis for treatment or other  patient management decisions.  A negative result may occur with  improper specimen collection / handling, submission of specimen other  than nasopharyngeal swab, presence of viral mutation(s) within the  areas targeted by this assay, and inadequate number of viral copies  (<250 copies / mL). A negative result must be combined with clinical  observations, patient history, and epidemiological information. If result is POSITIVE SARS-CoV-2 target nucleic acids are DETECTED. The SARS-CoV-2 RNA is generally detectable in upper and lower   respiratory specimens dur ing the acute phase of infection.  Positive  results are indicative of active infection with SARS-CoV-2.  Clinical  correlation with patient history and other diagnostic information is  necessary to determine patient infection status.  Positive results do  not rule out bacterial infection or co-infection with other viruses. If result  is PRESUMPTIVE POSTIVE SARS-CoV-2 nucleic acids MAY BE PRESENT.   A presumptive positive result was obtained on the submitted specimen  and confirmed on repeat testing.  While 2019 novel coronavirus  (SARS-CoV-2) nucleic acids may be present in the submitted sample  additional confirmatory testing may be necessary for epidemiological  and / or clinical management purposes  to differentiate between  SARS-CoV-2 and other Sarbecovirus currently known to infect humans.  If clinically indicated additional testing with an alternate test  methodology [phone]) is advised. The SARS-CoV-2 RNA is generally  detectable in upper and lower respiratory sp ecimens during the acute  phase of infection. The expected result is Negative. Fact Sheet for Patients:  BoilerBrush.com.cyhttps://www.fda.gov/media/136312/download Fact Sheet for Healthcare Providers: https://pope.com/https://www.fda.gov/media/136313/download This test is not yet approved or cleared by the [place] FDA and has been authorized for detection and/or diagnosis of SARS-CoV-2 by FDA under an Emergency Use Authorization (EUA).  This EUA will remain in effect (meaning this test can be used) for the duration of the COVID-19 declaration under Section 564(b)(1) of the Act, 21 U.S.C. section 360bbb-3(b)(1), unless the authorization is terminated or revoked sooner. Performed at [facility], [address] [place], [place] [address]   MRSA PCR Screening     Status: None   Collection Time: 01/09/19  1:30 AM   Specimen: Nasal Mucosa; Nasopharyngeal  Result Value Ref Range Status   MRSA by PCR  NEGATIVE NEGATIVE Final    Comment:        The GeneXpert MRSA Assay (FDA approved for NASAL specimens only), is one component of a comprehensive MRSA colonization surveillance program. It is not intended to diagnose MRSA infection nor to guide or monitor treatment for MRSA infections. Performed at [facility], [address] [place], [place] [address]          Radiology Studies: No results found.      Scheduled Meds: . Chlorhexidine Gluconate Cloth  6 each Topical Daily  . doxycycline  100 mg Oral Q12H  . folic acid  1 mg Oral Daily  . ipratropium  0.5 mg Nebulization TID  . levalbuterol  1.25 mg Nebulization TID  . loratadine  10 mg Oral Daily  . mouth rinse  15 mL Mouth Rinse BID  . metoprolol succinate  12.5 mg Oral Once  . metoprolol succinate  50 mg Oral Daily  . mycophenolate  1,000 mg Oral BID  . pantoprazole  40 mg Oral Daily  . polyethylene glycol  17 g Oral Daily  . predniSONE  20 mg Oral Q breakfast   Followed by  . [START ON 01/13/2019] predniSONE  10 mg Oral Q breakfast  . ramelteon  8 mg Oral QHS  . senna-docusate  2 tablet Oral BID  . thiamine  100 mg Oral Daily   Continuous Infusions: . sodium chloride Stopped (01/09/19 1537)  . diltiazem (CARDIZEM) infusion Stopped (01/09/19 1518)     LOS: 3 days    Time spent: 25 minutes    Dorcas CarrowKuber Reena Borromeo, MD [facility] Pager [phone]  If 7PM-7AM, please contact night-coverage [email or web address] Password [email or web address] 01/11/2019, 9:59 AM

## 2019-01-11 NOTE — Progress Notes (Signed)
Had 7 beat run v tach.  Pt stable.  Wife at bedside.

## 2019-01-12 ENCOUNTER — Inpatient Hospital Stay (HOSPITAL_COMMUNITY): Payer: Medicare Other

## 2019-01-12 LAB — BASIC METABOLIC PANEL
Anion gap: 12 (ref 5–15)
BUN: 38 mg/dL — ABNORMAL HIGH (ref 8–23)
CO2: 26 mmol/L (ref 22–32)
Calcium: 9 mg/dL (ref 8.9–10.3)
Chloride: 107 mmol/L (ref 98–111)
Creatinine, Ser: 1.76 mg/dL — ABNORMAL HIGH (ref 0.61–1.24)
GFR calc Af Amer: 40 mL/min — ABNORMAL LOW (ref >60)
GFR calc non Af Amer: 34 mL/min — ABNORMAL LOW (ref >60)
Glucose, Bld: 130 mg/dL — ABNORMAL HIGH (ref 70–99)
Potassium: 4.2 mmol/L (ref 3.5–5.1)
Sodium: 145 mmol/L (ref 135–145)

## 2019-01-12 LAB — BLOOD GAS, ARTERIAL
Acid-Base Excess: 0.7 mmol/L (ref 0.0–2.0)
Bicarbonate: 24.8 mmol/L (ref 20.0–28.0)
Drawn by: 308601
FIO2: 60
O2 Content: 15 L/min
O2 Saturation: 95.7 %
Patient temperature: 98.4
pCO2 arterial: 40 mmHg (ref 32.0–48.0)
pH, Arterial: 7.409 (ref 7.350–7.450)
pO2, Arterial: 81.1 mmHg — ABNORMAL LOW (ref 83.0–108.0)

## 2019-01-12 LAB — CBC
HCT: 36.7 % — ABNORMAL LOW (ref 39.0–52.0)
Hemoglobin: 11 g/dL — ABNORMAL LOW (ref 13.0–17.0)
MCH: 31.1 pg (ref 26.0–34.0)
MCHC: 30 g/dL (ref 30.0–36.0)
MCV: 103.7 fL — ABNORMAL HIGH (ref 80.0–100.0)
Platelets: 176 10*3/uL (ref 150–400)
RBC: 3.54 MIL/uL — ABNORMAL LOW (ref 4.22–5.81)
RDW: 14.2 % (ref 11.5–15.5)
WBC: 12.7 10*3/uL — ABNORMAL HIGH (ref 4.0–10.5)
nRBC: 0 % (ref 0.0–0.2)

## 2019-01-12 LAB — MAGNESIUM: Magnesium: 2.6 mg/dL — ABNORMAL HIGH (ref 1.7–2.4)

## 2019-01-12 MED ORDER — PANTOPRAZOLE SODIUM 40 MG IV SOLR
40.0000 mg | INTRAVENOUS | Status: DC
  Administered 2019-01-12 – 2019-01-13 (×2): 40 mg via INTRAVENOUS
  Filled 2019-01-12 (×2): qty 40

## 2019-01-12 MED ORDER — METHYLPREDNISOLONE SODIUM SUCC 40 MG IJ SOLR
20.0000 mg | Freq: Every day | INTRAMUSCULAR | Status: DC
  Administered 2019-01-12 – 2019-01-16 (×5): 20 mg via INTRAVENOUS
  Filled 2019-01-12 (×5): qty 1

## 2019-01-12 MED ORDER — SODIUM CHLORIDE 0.9 % IV SOLN
100.0000 mg | Freq: Two times a day (BID) | INTRAVENOUS | Status: DC
  Administered 2019-01-12 – 2019-01-13 (×3): 100 mg via INTRAVENOUS
  Filled 2019-01-12 (×4): qty 100

## 2019-01-12 MED ORDER — HALOPERIDOL LACTATE 5 MG/ML IJ SOLN
5.0000 mg | Freq: Four times a day (QID) | INTRAMUSCULAR | Status: DC | PRN
  Administered 2019-01-12 – 2019-01-18 (×6): 5 mg via INTRAVENOUS
  Filled 2019-01-12 (×6): qty 1

## 2019-01-12 MED ORDER — LABETALOL HCL 5 MG/ML IV SOLN
INTRAVENOUS | Status: AC
  Administered 2019-01-12: 22:00:00 20 mg via INTRAVENOUS
  Filled 2019-01-12: qty 4

## 2019-01-12 MED ORDER — LABETALOL HCL 5 MG/ML IV SOLN
20.0000 mg | Freq: Once | INTRAVENOUS | Status: AC
  Administered 2019-01-12: 22:00:00 20 mg via INTRAVENOUS
  Filled 2019-01-12: qty 4

## 2019-01-12 NOTE — Progress Notes (Signed)
PROGRESS NOTE    Tanner LoraPrentice Milbrath Jr.  ONG:[id number] DOB: [birth year] DOA: 01/04/2019 PCP: System, Pcp Not In    Brief Narrative:   85 year old gentleman with multiple comorbidities including history of hypertension, COPD with pulmonary fibrosis, chronic hypoxic failure on 3-4 L of oxygen at home, AAA, CAD, nephrolithiasis and recent left ureteric stent placement and planned staged procedure by urology, psoriatic arthritis, GERD, h/o parano who presented to the emergency room with complaints of worsening shortness of breath, productive cough and one episode of blood-tinged sputum after discharge from hospital in 24 hours. Patient was found to have exacerbation of underlying interstitial lung disease and is admitted to the hospital for treatment.  Followed by pulmonology.  COVID-19 negative.  Initially required 5 L oxygen and now fluctuating.  01/09/2019: Patient has developed A. fib with RVR.  Apparently has history of paroxysmal A. fib.  Echocardiogram was normal. Difficult rate control.   01/10/2019: Patient has developed delirium, agitation requiring restraints and sitter.  Patient does have history of paranoid episodes.  Continues to be delirious and needing restraints.  Did not have good response to benzodiazepine with history of alcoholism, patient continues to remain delirious, agitated most of the time. Remains in the hospital mostly because of agitation delirium.  Assessment & Plan:   Active Problems:   Left nephrolithiasis   Cough with hemoptysis   PAD (peripheral artery disease) (HCC)   COPD (chronic obstructive pulmonary disease) (HCC)   Psoriatic arthritis (HCC)   Pulmonary fibrosis (HCC)   Hypertension  Acute on chronic hypoxic respiratory failure, ILD exacerbation with small volume hemoptysis: Continue bronchodilator therapy.  Was given high-dose steroids and developed delirium, tapering to small doses.  Unable to take any oral prednisone now, changing to 20 mg Solu-Medrol  today. Chest x-ray today shows left lower lobe infiltration, patient remains on doxycycline, changed to IV will continue. Cultures has been negative so far. Patient is on 3 to 4 L of oxygen already at home.  Interstitial lung disease: Has advanced interstitial lung disease.  Currently remains on disease modifying agent with CellCept and prednisone.  Started on high-dose steroid and tapering down.  Follows up with pulmonology outpatient.  Tapering dose of prednisone to resume home dose.  Seen by PCCM and signed off.  Stage III CKD due to hypertension: Fluctuating levels.  Now trending down to baseline.  Paroxysmal A. fib with RVR: Worsened with hypoxia and agitation.  Treated with increasing dose of metoprolol.  Unreliable oral intake because of agitation and delirium.  Keeping on IV metoprolol.  Cannot have amiodarone because of advanced lung disease. He will benefit with anticoagulation, he is scheduled for urological procedure.  We will follow-up outpatient with cardiology.    Sundowning/delirium: Patient developed delirium in the hospital.  Difficult to control symptoms.  On soft restraints.  On haldol. On ativan with h/o alcoholism. Will allow family to stay in the hospital. Was on escalating dose of benzodiazepine with no response. We will discontinue benzos, will keep on increasing dose of Haldol. Continue Recruitment consultantsafety sitter and restraint. Wife will be able to come to bedside.  Hypertension: Blood pressures are stable.   GERD: On PPI.  Continue.  DVT prophylaxis: SCDs Code Status: Full code Family Communication: patient's wife. Disposition Plan: Inpatient, stepdown unit today.   Consultants:   PCCM, signed off  Procedures:   None  Antimicrobials:   Cefepime, 01/11/2019-01/09/2019  Azithromycin, 01/09/2019--- 01/09/2019  Doxycycline, 01/09/2019---   Subjective: Patient was seen and examined at the bedside.  Still  remains agitated and needing soft restraints to save him from  fall and protect IV lines. Remained tachycardic overnight.  No way to give oral medications. Continues to be on as needed metoprolol. Overnight events noted.  He was noted to be in more distress, blood gas shows hypoxia.  Most of the time he is not able to keep cannula in his nose because of agitation.  Was started on nonrebreather, now back to nasal cannula.  Objective: Vitals:   01/12/19 0400 01/12/19 0700 01/12/19 0741 01/12/19 0747  BP: (!) 177/95     Pulse: (!) 135 (!) 104  (!) 126  Resp: 17 (!) 24  (!) 26  Temp: 98.4 F (36.9 C) 98.5 F (36.9 C)    TempSrc: Axillary Axillary    SpO2: 93% 96% 91% 90%  Weight:      Height:        Intake/Output Summary (Last 24 hours) at 01/12/2019 1059 Last data filed at 01/12/2019 0500 Gross per 24 hour  Intake -  Output 550 ml  Net -550 ml   Filed Weights   08-09-18 2127 01/11/19 0500  Weight: 106.1 kg 102.9 kg    Examination:  General exam: Appears anxious and agitated and delirium. Currently on 6 L of oxygen.   Respiratory system: Mostly bilateral conducted airway sounds. expiratory wheezes.   Cardiovascular system: S1 & S2 heard, irregular and tachycardic . No JVD, murmurs, rubs, gallops or clicks. No pedal edema. Gastrointestinal system: Abdomen is nondistended, soft and nontender. No organomegaly or masses felt. Normal bowel sounds heard.  Obese and pendulous. Central nervous system: Impulsive and paranoid.  No focal neurological deficits. Extremities: Symmetric 5 x 5 power. Skin: No rashes, lesions or ulcers Psychiatry: Judgement and insight appear altered.  Mood & affect flat and anxious.    Data Reviewed: I have personally reviewed following labs and imaging studies  CBC: Recent Labs  Lab 01/07/19 0510 08-09-18 1157 01/09/19 0315 01/11/19 0219 01/12/19 0221  WBC 4.7 8.5 4.5 13.2* 12.7*  NEUTROABS  --  7.0  --  10.9*  --   HGB 11.1* 10.2* 9.8* 11.3* 11.0*  HCT 37.5* 32.8* 32.4* 37.8* 36.7*  MCV 103.9* 101.2*  101.6* 100.3* 103.7*  PLT 170 168 158 188 176   Basic Metabolic Panel: Recent Labs  Lab 08-09-18 1157 01/09/19 0315 01/10/19 0256 01/11/19 0219 01/12/19 0221  NA 139 140 138 141 145  K 3.6 3.8 4.2 4.4 4.2  CL 105 103 101 105 107  CO2 22 25 23 23 26   GLUCOSE 117* 138* 120* 126* 130*  BUN 28* 25* 35* 35* 38*  CREATININE 1.79* 1.78* 2.01* 1.75* 1.76*  CALCIUM 9.0 8.5* 9.0 9.1 9.0  MG  --  1.9 2.4 2.3 2.6*   GFR: Estimated Creatinine Clearance: 38.1 mL/min (A) (by C-G formula based on SCr of 1.76 mg/dL (H)). Liver Function Tests: Recent Labs  Lab 01/06/19 0220 08-09-18 1157  AST 23 25  ALT 23 20  ALKPHOS 43 31*  BILITOT 0.6 0.5  PROT 8.1 6.4*  ALBUMIN 4.4 3.6   Recent Labs  Lab 01/06/19 0220  LIPASE 26   No results for input(s): AMMONIA in the last 168 hours. Coagulation Profile: No results for input(s): INR, PROTIME in the last 168 hours. Cardiac Enzymes: No results for input(s): CKTOTAL, CKMB, CKMBINDEX, TROPONINI in the last 168 hours. BNP (last 3 results) No results for input(s): PROBNP in the last 8760 hours. HbA1C: No results for input(s): HGBA1C in the last 72 hours.  CBG: Recent Labs  Lab 01/09/19 0742  GLUCAP 150*   Lipid Profile: No results for input(s): CHOL, HDL, LDLCALC, TRIG, CHOLHDL, LDLDIRECT in the last 72 hours. Thyroid Function Tests: Recent Labs    01/09/19 1342  TSH 0.538   Anemia Panel: No results for input(s): VITAMINB12, FOLATE, FERRITIN, TIBC, IRON, RETICCTPCT in the last 72 hours. Sepsis Labs: Recent Labs  Lab 01/06/19 0606 01/06/2019 1157 01/14/2019 2047 01/09/19 0315 01/10/19 0256  PROCALCITON  --   --  <0.10 <0.10 <0.10  LATICACIDVEN 1.5 0.9  --   --   --     Recent Results (from the past 240 hour(s))  SARS Coronavirus 2 (CEPHEID - Performed in [facility] hospital lab), Hosp Order     Status: None   Collection Time: 01/06/19  3:18 AM   Specimen: Nasopharyngeal Swab  Result Value Ref Range Status   SARS Coronavirus  2 NEGATIVE NEGATIVE Final    Comment: (NOTE) If result is NEGATIVE SARS-CoV-2 target nucleic acids are NOT DETECTED. The SARS-CoV-2 RNA is generally detectable in upper and lower  respiratory specimens during the acute phase of infection. The lowest  concentration of SARS-CoV-2 viral copies this assay can detect is 250  copies / mL. A negative result does not preclude SARS-CoV-2 infection  and should not be used as the sole basis for treatment or other  patient management decisions.  A negative result may occur with  improper specimen collection / handling, submission of specimen other  than nasopharyngeal swab, presence of viral mutation(s) within the  areas targeted by this assay, and inadequate number of viral copies  (<250 copies / mL). A negative result must be combined with clinical  observations, patient history, and epidemiological information. If result is POSITIVE SARS-CoV-2 target nucleic acids are DETECTED. The SARS-CoV-2 RNA is generally detectable in upper and lower  respiratory specimens dur ing the acute phase of infection.  Positive  results are indicative of active infection with SARS-CoV-2.  Clinical  correlation with patient history and other diagnostic information is  necessary to determine patient infection status.  Positive results do  not rule out bacterial infection or co-infection with other viruses. If result is PRESUMPTIVE POSTIVE SARS-CoV-2 nucleic acids MAY BE PRESENT.   A presumptive positive result was obtained on the submitted specimen  and confirmed on repeat testing.  While 2019 novel coronavirus  (SARS-CoV-2) nucleic acids may be present in the submitted sample  additional confirmatory testing may be necessary for epidemiological  and / or clinical management purposes  to differentiate between  SARS-CoV-2 and other Sarbecovirus currently known to infect humans.  If clinically indicated additional testing with an alternate test  methodology  [phone] is advised. The SARS-CoV-2 RNA is generally  detectable in upper and lower respiratory sp ecimens during the acute  phase of infection. The expected result is Negative. Fact Sheet for Patients:  BoilerBrush.com.cyhttps://www.fda.gov/media/136312/download Fact Sheet for Healthcare Providers: https://pope.com/https://www.fda.gov/media/136313/download This test is not yet approved or cleared by the [place] FDA and has been authorized for detection and/or diagnosis of SARS-CoV-2 by FDA under an Emergency Use Authorization (EUA).  This EUA will remain in effect (meaning this test can be used) for the duration of the COVID-19 declaration under Section 564(b)(1) of the Act, 21 U.S.C. section 360bbb-3(b)(1), unless the authorization is terminated or revoked sooner. Performed at [facility], [address] [place], [place] [id number]   MRSA PCR Screening     Status: None   Collection Time: 01/06/19  1:44 PM   Specimen: Nasal Mucosa; Nasopharyngeal  Result Value Ref Range Status   MRSA by PCR NEGATIVE NEGATIVE Final    Comment:        The GeneXpert MRSA Assay (FDA approved for NASAL specimens only), is one component of a comprehensive MRSA colonization surveillance program. It is not intended to diagnose MRSA infection nor to guide or monitor treatment for MRSA infections. Performed at [facility], [address] [place], [place] [address]   Culture, blood (Routine X 2) w Reflex to ID Panel     Status: None (Preliminary result)   Collection Time: 01/05/2019 11:57 AM   Specimen: BLOOD  Result Value Ref Range Status   Specimen Description   Final    BLOOD RIGHT ARM Performed at [facility], [address], [place], [place] [address]    Special Requests   Final    BOTTLES DRAWN AEROBIC AND ANAEROBIC Blood Culture adequate volume Performed at [facility], [address], [place], [place] [address]    Culture   Final    NO  GROWTH 3 DAYS Performed at [facility], [address] [place], [place] [address]    Report Status PENDING  Incomplete  Urine Culture     Status: None   Collection Time: 01/29/2019 12:57 PM   Specimen: Urine, Random  Result Value Ref Range Status   Specimen Description   Final    URINE, RANDOM Performed at [facility], [address], [place], [place] [address]    Special Requests   Final    NONE Performed at [facility], [address], [place], [place] [address]    Culture   Final    NO GROWTH Performed at [facility], [address], [place], [place] [address]    Report Status 01/09/2019 FINAL  Final  Culture, blood (Routine X 2) w Reflex to ID Panel     Status: None (Preliminary result)   Collection Time: 01/27/2019 12:57 PM   Specimen: BLOOD  Result Value Ref Range Status   Specimen Description   Final    BLOOD RIGHT ANTECUBITAL Performed at [facility], [address], [place], [place] [address]    Special Requests   Final    BOTTLES DRAWN AEROBIC ONLY Blood Culture results may not be optimal due to an inadequate volume of blood received in culture bottles Performed at [facility], [address] [place], [place] [address]    Culture   Final    NO GROWTH 3 DAYS Performed at [facility], [address] [place], [place] [address]    Report Status PENDING  Incomplete  SARS Coronavirus 2 (CEPHEID- Performed in [facility] hospital lab), Hosp Order     Status: None   Collection Time: 12/30/2018 12:57 PM   Specimen: Nasopharyngeal Swab  Result Value Ref Range Status   SARS Coronavirus 2 NEGATIVE NEGATIVE Final    Comment: (NOTE) If result is NEGATIVE SARS-CoV-2 target nucleic acids are NOT DETECTED. The SARS-CoV-2 RNA is generally detectable in upper and lower  respiratory specimens during the acute phase of infection. The lowest  concentration of SARS-CoV-2 viral copies  this assay can detect is 250  copies / mL. A negative result does not preclude SARS-CoV-2 infection  and should not be used as the sole basis for treatment or other  patient management decisions.  A negative result may occur  with  improper specimen collection / handling, submission of specimen other  than nasopharyngeal swab, presence of viral mutation(s) within the  areas targeted by this assay, and inadequate number of viral copies  (<250 copies / mL). A negative result must be combined with clinical  observations, patient history, and epidemiological information. If result is POSITIVE SARS-CoV-2 target nucleic acids are DETECTED. The SARS-CoV-2 RNA is generally detectable in upper and lower  respiratory specimens dur ing the acute phase of infection.  Positive  results are indicative of active infection with SARS-CoV-2.  Clinical  correlation with patient history and other diagnostic information is  necessary to determine patient infection status.  Positive results do  not rule out bacterial infection or co-infection with other viruses. If result is PRESUMPTIVE POSTIVE SARS-CoV-2 nucleic acids MAY BE PRESENT.   A presumptive positive result was obtained on the submitted specimen  and confirmed on repeat testing.  While 2019 novel coronavirus  (SARS-CoV-2) nucleic acids may be present in the submitted sample  additional confirmatory testing may be necessary for epidemiological  and / or clinical management purposes  to differentiate between  SARS-CoV-2 and other Sarbecovirus currently known to infect humans.  If clinically indicated additional testing with an alternate test  methodology [phone] is advised. The SARS-CoV-2 RNA is generally  detectable in upper and lower respiratory sp ecimens during the acute  phase of infection. The expected result is Negative. Fact Sheet for Patients:  StrictlyIdeas.no Fact Sheet for Healthcare Providers:  BankingDealers.co.za This test is not yet approved or cleared by the [place] FDA and has been authorized for detection and/or diagnosis of SARS-CoV-2 by FDA under an Emergency Use Authorization (EUA).  This EUA will remain in effect (meaning this test can be used) for the duration of the COVID-19 declaration under Section 564(b)(1) of the Act, 21 U.S.C. section 360bbb-3(b)(1), unless the authorization is terminated or revoked sooner. Performed at [facility], [place] [address], [place], [place] [address]   MRSA PCR Screening     Status: None   Collection Time: 01/09/19  1:30 AM   Specimen: Nasal Mucosa; Nasopharyngeal  Result Value Ref Range Status   MRSA by PCR NEGATIVE NEGATIVE Final    Comment:        The GeneXpert MRSA Assay (FDA approved for NASAL specimens only), is one component of a comprehensive MRSA colonization surveillance program. It is not intended to diagnose MRSA infection nor to guide or monitor treatment for MRSA infections. Performed at [facility], [place] [address] [place], [place] [address]          Radiology Studies: Dg Chest Port 1 View  Result Date: 01/12/2019 CLINICAL DATA:  Respiratory distress. EXAM: PORTABLE CHEST 1 VIEW COMPARISON:  2019-01-29 and chest CT 01/06/2019 FINDINGS: Increased interstitial densities in the left lower chest. Again noted are prominent interstitial lung markings bilaterally. Stable appearance of the cardiomediastinal silhouette. Overall, there are slightly low lung volumes. Negative for a pneumothorax. IMPRESSION: Increased densities in the left lower lung. Findings are suggestive for acute on chronic disease. Findings could be related to an infectious etiology. Electronically Signed   By: Markus Daft M.D.   On: 01/12/2019 08:18        Scheduled Meds: . Chlorhexidine Gluconate Cloth  6 each Topical Daily  . folic acid  1 mg Oral Daily  . ipratropium  0.5  mg Nebulization TID  . levalbuterol  1.25 mg Nebulization TID  . mouth rinse  15 mL Mouth  Rinse BID  . methylPREDNISolone (SOLU-MEDROL) injection  20 mg Intravenous Daily  . mycophenolate  1,000 mg Oral BID  . pantoprazole (PROTONIX) IV  40 mg Intravenous Q24H  . polyethylene glycol  17 g Oral Daily  . ramelteon  8 mg Oral QHS  . senna-docusate  2 tablet Oral BID  . thiamine  100 mg Oral Daily   Continuous Infusions: . doxycycline (VIBRAMYCIN) IV 100 mg (01/12/19 0929)     LOS: 4 days    Time spent: 25 minutes    Dorcas Carrow, MD [facility] Pager [phone]  If 7PM-7AM, please contact night-coverage [email or web address] Password [facility] 01/12/2019, 10:59 AM

## 2019-01-12 NOTE — Progress Notes (Signed)
[place] Progress Note Patient Name: Tanner Cox. DOB: 03-Nov-1931 MRN: [id number]   Date of Service  01/12/2019  HPI/Events of Note  Constipation  eICU Interventions  Dulcolax supp 1 per rectum prn constipation x 3 days        Frederik Pear 01/12/2019, 11:15 PM

## 2019-01-12 NOTE — Progress Notes (Signed)
[facility] Progress Note Patient Name: Tanner Cox. DOB: 01/16/1932 MRN: [id number]   Date of Service  01/12/2019  HPI/Events of Note  Notified of increased work of breathing. Able to cough out and suction out secretions as per RT.  eICU Interventions  Obtain ABG, CBC and CXR and assess the need to escalate antibiotics.     Intervention Category Intermediate Interventions: Respiratory distress - evaluation and management  Judd Lien 01/12/2019, 6:23 AM

## 2019-01-12 NOTE — Progress Notes (Signed)
MEWS RED.  MD at bedside and evaluated pt.  Orders received.  Sitter at bedside for safety.  Pt remains confused and combative.

## 2019-01-12 NOTE — Progress Notes (Signed)
[place] Progress Note Patient Name: Tanner Cox. DOB: 22-Jun-1932 MRN: [id number]   Date of Service  01/12/2019  HPI/Events of Note  Safety sitter order about to expire in 1 hour  eICU Interventions  Order renewed        Frederik Pear 01/12/2019, 9:58 PM

## 2019-01-13 ENCOUNTER — Inpatient Hospital Stay (HOSPITAL_COMMUNITY): Payer: Medicare Other

## 2019-01-13 ENCOUNTER — Other Ambulatory Visit: Payer: Self-pay | Admitting: Urology

## 2019-01-13 DIAGNOSIS — N200 Calculus of kidney: Secondary | ICD-10-CM

## 2019-01-13 DIAGNOSIS — J9600 Acute respiratory failure, unspecified whether with hypoxia or hypercapnia: Secondary | ICD-10-CM

## 2019-01-13 DIAGNOSIS — I739 Peripheral vascular disease, unspecified: Secondary | ICD-10-CM

## 2019-01-13 DIAGNOSIS — J9601 Acute respiratory failure with hypoxia: Secondary | ICD-10-CM

## 2019-01-13 DIAGNOSIS — Z452 Encounter for adjustment and management of vascular access device: Secondary | ICD-10-CM

## 2019-01-13 LAB — BLOOD GAS, ARTERIAL
Acid-Base Excess: 0.6 mmol/L (ref 0.0–2.0)
Acid-Base Excess: 0.6 mmol/L (ref 0.0–2.0)
Acid-base deficit: 0 mmol/L (ref 0.0–2.0)
Acid-base deficit: 0.1 mmol/L (ref 0.0–2.0)
Bicarbonate: 26.9 mmol/L (ref 20.0–28.0)
Bicarbonate: 25.1 mmol/L (ref 20.0–28.0)
Bicarbonate: 25.3 mmol/L (ref 20.0–28.0)
Bicarbonate: 24.8 mmol/L (ref 20.0–28.0)
Drawn by: 441261
Drawn by: 441261
Drawn by: 441261
Drawn by: 441261
FIO2: 100
FIO2: 100
FIO2: 60
FIO2: 40
MECHVT: 620 mL
MECHVT: 540 mL
MECHVT: 460 mL
O2 Content: 25 L/min
O2 Saturation: 93.1 %
O2 Saturation: 99.7 %
O2 Saturation: 99.2 %
O2 Saturation: 97.2 %
PEEP: 10 cmH2O
PEEP: 10 cmH2O
PEEP: 8 cmH2O
Patient temperature: 98.6
Patient temperature: 98.6
Patient temperature: 98.6
Patient temperature: 98.6
RATE: 16 resp/min
RATE: 22 resp/min
RATE: 28 resp/min
pCO2 arterial: 55.4 mmHg — ABNORMAL HIGH (ref 32.0–48.0)
pCO2 arterial: 42.7 mmHg (ref 32.0–48.0)
pCO2 arterial: 46.9 mmHg (ref 32.0–48.0)
pCO2 arterial: 44 mmHg (ref 32.0–48.0)
pH, Arterial: 7.307 — ABNORMAL LOW (ref 7.350–7.450)
pH, Arterial: 7.387 (ref 7.350–7.450)
pH, Arterial: 7.35 (ref 7.350–7.450)
pH, Arterial: 7.369 (ref 7.350–7.450)
pO2, Arterial: 78.2 mmHg — ABNORMAL LOW (ref 83.0–108.0)
pO2, Arterial: 408 mmHg — ABNORMAL HIGH (ref 83.0–108.0)
pO2, Arterial: 202 mmHg — ABNORMAL HIGH (ref 83.0–108.0)
pO2, Arterial: 100 mmHg (ref 83.0–108.0)

## 2019-01-13 LAB — CBC WITH DIFFERENTIAL/PLATELET
Abs Immature Granulocytes: 0.05 10*3/uL (ref 0.00–0.07)
Basophils Absolute: 0 10*3/uL (ref 0.0–0.1)
Basophils Relative: 0 %
Eosinophils Absolute: 0 10*3/uL (ref 0.0–0.5)
Eosinophils Relative: 0 %
HCT: 33.8 % — ABNORMAL LOW (ref 39.0–52.0)
Hemoglobin: 10.3 g/dL — ABNORMAL LOW (ref 13.0–17.0)
Immature Granulocytes: 1 %
Lymphocytes Relative: 3 %
Lymphs Abs: 0.3 10*3/uL — ABNORMAL LOW (ref 0.7–4.0)
MCH: 30.8 pg (ref 26.0–34.0)
MCHC: 30.5 g/dL (ref 30.0–36.0)
MCV: 101.2 fL — ABNORMAL HIGH (ref 80.0–100.0)
Monocytes Absolute: 0.6 10*3/uL (ref 0.1–1.0)
Monocytes Relative: 5 %
Neutro Abs: 10 10*3/uL — ABNORMAL HIGH (ref 1.7–7.7)
Neutrophils Relative %: 91 %
Platelets: 159 10*3/uL (ref 150–400)
RBC: 3.34 MIL/uL — ABNORMAL LOW (ref 4.22–5.81)
RDW: 14 % (ref 11.5–15.5)
WBC: 10.9 10*3/uL — ABNORMAL HIGH (ref 4.0–10.5)
nRBC: 0 % (ref 0.0–0.2)

## 2019-01-13 LAB — BASIC METABOLIC PANEL WITH GFR
Anion gap: 11 (ref 5–15)
BUN: 44 mg/dL — ABNORMAL HIGH (ref 8–23)
CO2: 25 mmol/L (ref 22–32)
Calcium: 9.3 mg/dL (ref 8.9–10.3)
Chloride: 111 mmol/L (ref 98–111)
Creatinine, Ser: 1.72 mg/dL — ABNORMAL HIGH (ref 0.61–1.24)
GFR calc Af Amer: 41 mL/min — ABNORMAL LOW
GFR calc non Af Amer: 35 mL/min — ABNORMAL LOW
Glucose, Bld: 129 mg/dL — ABNORMAL HIGH (ref 70–99)
Potassium: 4.5 mmol/L (ref 3.5–5.1)
Sodium: 147 mmol/L — ABNORMAL HIGH (ref 135–145)

## 2019-01-13 LAB — CULTURE, BLOOD (ROUTINE X 2)
Culture: NO GROWTH
Culture: NO GROWTH
Special Requests: ADEQUATE

## 2019-01-13 LAB — MAGNESIUM
Magnesium: 2.7 mg/dL — ABNORMAL HIGH (ref 1.7–2.4)
Magnesium: 2.5 mg/dL — ABNORMAL HIGH (ref 1.7–2.4)
Magnesium: 2.8 mg/dL — ABNORMAL HIGH (ref 1.7–2.4)

## 2019-01-13 LAB — HEMOGLOBIN AND HEMATOCRIT, BLOOD
HCT: 32.5 % — ABNORMAL LOW (ref 39.0–52.0)
Hemoglobin: 9.7 g/dL — ABNORMAL LOW (ref 13.0–17.0)

## 2019-01-13 LAB — GLUCOSE, CAPILLARY
Glucose-Capillary: 132 mg/dL — ABNORMAL HIGH (ref 70–99)
Glucose-Capillary: 140 mg/dL — ABNORMAL HIGH (ref 70–99)

## 2019-01-13 LAB — PHOSPHORUS
Phosphorus: 4.1 mg/dL (ref 2.5–4.6)
Phosphorus: 3.8 mg/dL (ref 2.5–4.6)

## 2019-01-13 MED ORDER — VANCOMYCIN HCL 10 G IV SOLR
1250.0000 mg | INTRAVENOUS | Status: DC
  Administered 2019-01-14: 1250 mg via INTRAVENOUS
  Filled 2019-01-13: qty 1250

## 2019-01-13 MED ORDER — ORAL CARE MOUTH RINSE
15.0000 mL | OROMUCOSAL | Status: DC
  Administered 2019-01-13 – 2019-01-17 (×25): 15 mL via OROMUCOSAL

## 2019-01-13 MED ORDER — FOLIC ACID 1 MG PO TABS
1.0000 mg | ORAL_TABLET | Freq: Every day | ORAL | Status: DC
  Administered 2019-01-14 – 2019-01-15 (×2): 1 mg
  Filled 2019-01-13 (×2): qty 1

## 2019-01-13 MED ORDER — PHENYLEPHRINE HCL-NACL 10-0.9 MG/250ML-% IV SOLN
0.0000 ug/min | INTRAVENOUS | Status: DC
  Administered 2019-01-13: 13:00:00 20 ug/min via INTRAVENOUS
  Administered 2019-01-13: 23:00:00 50 ug/min via INTRAVENOUS
  Administered 2019-01-13 (×2): 70 ug/min via INTRAVENOUS
  Administered 2019-01-14 (×2): 35 ug/min via INTRAVENOUS
  Administered 2019-01-14: 25 ug/min via INTRAVENOUS
  Administered 2019-01-15: 15 ug/min via INTRAVENOUS
  Filled 2019-01-13 (×9): qty 250

## 2019-01-13 MED ORDER — MELATONIN 3 MG PO TABS
3.0000 mg | ORAL_TABLET | Freq: Every evening | ORAL | Status: DC | PRN
  Administered 2019-01-14: 3 mg
  Filled 2019-01-13 (×2): qty 1

## 2019-01-13 MED ORDER — FENTANYL CITRATE (PF) 100 MCG/2ML IJ SOLN
INTRAMUSCULAR | Status: AC
  Filled 2019-01-13: qty 2

## 2019-01-13 MED ORDER — MYCOPHENOLATE 200 MG/ML ORAL SUSPENSION
1000.0000 mg | Freq: Two times a day (BID) | ORAL | Status: DC
  Administered 2019-01-14: 1000 mg
  Filled 2019-01-13 (×8): qty 20

## 2019-01-13 MED ORDER — MIDAZOLAM HCL 2 MG/2ML IJ SOLN
INTRAMUSCULAR | Status: AC
  Administered 2019-01-13: 13:00:00 1 mg
  Filled 2019-01-13: qty 4

## 2019-01-13 MED ORDER — VANCOMYCIN HCL 10 G IV SOLR
2000.0000 mg | Freq: Once | INTRAVENOUS | Status: AC
  Administered 2019-01-13: 2000 mg via INTRAVENOUS
  Filled 2019-01-13: qty 2000

## 2019-01-13 MED ORDER — SODIUM CHLORIDE 0.9% FLUSH: 10.0000 mL | INTRAVENOUS | Status: DC | PRN

## 2019-01-13 MED ORDER — FENTANYL CITRATE (PF) 100 MCG/2ML IJ SOLN
25.0000 ug | INTRAMUSCULAR | Status: DC | PRN
  Administered 2019-01-13: 25 ug via INTRAVENOUS
  Filled 2019-01-13: qty 2

## 2019-01-13 MED ORDER — CHLORHEXIDINE GLUCONATE 0.12% ORAL RINSE (MEDLINE KIT)
15.0000 mL | Freq: Two times a day (BID) | OROMUCOSAL | Status: DC
  Administered 2019-01-13 – 2019-01-16 (×5): 15 mL via OROMUCOSAL

## 2019-01-13 MED ORDER — PIPERACILLIN-TAZOBACTAM 3.375 G IVPB
3.3750 g | Freq: Three times a day (TID) | INTRAVENOUS | Status: DC
  Administered 2019-01-13 – 2019-01-18 (×17): 3.375 g via INTRAVENOUS
  Filled 2019-01-13 (×17): qty 50

## 2019-01-13 MED ORDER — DEXMEDETOMIDINE HCL IN NACL 400 MCG/100ML IV SOLN
0.2000 ug/kg/h | INTRAVENOUS | Status: DC
  Administered 2019-01-13: 0.8 ug/kg/h via INTRAVENOUS
  Administered 2019-01-13: 0.2 ug/kg/h via INTRAVENOUS
  Administered 2019-01-13: 0.4 ug/kg/h via INTRAVENOUS
  Filled 2019-01-13 (×2): qty 100

## 2019-01-13 MED ORDER — METOPROLOL TARTRATE 5 MG/5ML IV SOLN
5.0000 mg | Freq: Four times a day (QID) | INTRAVENOUS | Status: DC
  Administered 2019-01-13: 12:00:00 5 mg via INTRAVENOUS
  Filled 2019-01-13 (×2): qty 5

## 2019-01-13 MED ORDER — FENTANYL CITRATE (PF) 100 MCG/2ML IJ SOLN
25.0000 ug | INTRAMUSCULAR | Status: DC | PRN
  Administered 2019-01-13: 50 ug via INTRAVENOUS
  Administered 2019-01-13: 75 ug via INTRAVENOUS
  Administered 2019-01-14 – 2019-01-15 (×20): 100 ug via INTRAVENOUS
  Filled 2019-01-13 (×24): qty 2

## 2019-01-13 MED ORDER — VITAL HIGH PROTEIN PO LIQD
1000.0000 mL | ORAL | Status: DC
  Administered 2019-01-13: 18:00:00 1000 mL

## 2019-01-13 MED ORDER — SODIUM CHLORIDE 0.9% FLUSH
10.0000 mL | Freq: Two times a day (BID) | INTRAVENOUS | Status: DC
  Administered 2019-01-13 – 2019-01-15 (×3): 10 mL
  Administered 2019-01-16: 21:00:00 40 mL
  Administered 2019-01-16 – 2019-01-17 (×3): 10 mL

## 2019-01-13 MED ORDER — FENTANYL CITRATE (PF) 100 MCG/2ML IJ SOLN
INTRAMUSCULAR | Status: AC
  Administered 2019-01-13: 100 ug
  Filled 2019-01-13: qty 2

## 2019-01-13 MED ORDER — DEXMEDETOMIDINE HCL IN NACL 200 MCG/50ML IV SOLN
0.0000 ug/kg/h | INTRAVENOUS | Status: DC
  Administered 2019-01-13: 15:00:00 0.6 ug/kg/h via INTRAVENOUS
  Administered 2019-01-13: 0.4 ug/kg/h via INTRAVENOUS
  Administered 2019-01-13: 23:00:00 0.2 ug/kg/h via INTRAVENOUS
  Filled 2019-01-13 (×3): qty 50

## 2019-01-13 MED ORDER — SENNOSIDES-DOCUSATE SODIUM 8.6-50 MG PO TABS
2.0000 | ORAL_TABLET | Freq: Two times a day (BID) | ORAL | Status: DC
  Administered 2019-01-13 – 2019-01-15 (×4): 2
  Filled 2019-01-13 (×4): qty 2

## 2019-01-13 MED ORDER — NICARDIPINE HCL IN NACL 20-0.86 MG/200ML-% IV SOLN
0.0000 mg/h | INTRAVENOUS | Status: DC
  Administered 2019-01-13: 07:00:00 2.5 mg/h via INTRAVENOUS
  Administered 2019-01-13: 04:00:00 5 mg/h via INTRAVENOUS
  Filled 2019-01-13 (×3): qty 200

## 2019-01-13 MED ORDER — PRO-STAT SUGAR FREE PO LIQD
30.0000 mL | Freq: Two times a day (BID) | ORAL | Status: DC
  Administered 2019-01-13: 22:00:00 30 mL
  Filled 2019-01-13 (×2): qty 30

## 2019-01-13 MED ORDER — NON FORMULARY: 3.0000 mg | Freq: Every evening | Status: DC | PRN

## 2019-01-13 MED ORDER — PHENYLEPHRINE HCL-NACL 10-0.9 MG/250ML-% IV SOLN
INTRAVENOUS | Status: AC
  Filled 2019-01-13: qty 250

## 2019-01-13 NOTE — Procedures (Signed)
Video Bronchoscopy Procedure Note  Date of Operation: 01/13/2019  Pre-op Diagnosis: Acute respiratory failure, hemoptysis  Post-op Diagnosis: Same  Surgeon: Baltazar Apo  Assistants: none  Anesthesia: Moderate sedation  Meds Given: Precedex infusion, fentanyl 100 Mcg x1   Operation: Flexible video fiberoptic bronchoscopy with lavage  Estimated Blood Loss: 0 cc  Complications: none noted  Indications and History: Tanner Stene. is 85 y.o. with history of ILD.  Admitted with acute respiratory failure, possible ILD flare.  Course complicated by progressive encephalopathy and acute respiratory failure, bilateral infiltrates suggestive of possible infection.  Also with hemoptysis..  Recommendation was to perform video fiberoptic bronchoscopy. The risks, benefits, complications, treatment options and expected outcomes were discussed with family.  The possibilities of pneumothorax, pneumonia, reaction to medication, pulmonary aspiration, perforation of a viscus, bleeding, failure to diagnose a condition and creating a complication requiring transfusion or operation were discussed.  Consent obtained  Description of Procedure: The patient was identified as Tanner Cox. and the procedure verified as Flexible Video Fiberoptic Bronchoscopy.  A Time Out was held and the above information confirmed.   Conscious sedation was initiated as indicated above. The video fiberoptic bronchoscope was introduced via the patient's endotracheal tube and a general inspection was performed.  There were old clots present in the ET tube, bilateral mainstem bronchi extending down into the lower lobes.  These were suctioned clear.  After clearance of clots inspection of all airways was performed, was grossly normal.  There were no endobronchial lesions or abnormal secretions.  In particular there was no evidence of active bleeding from any airway.  Bronchoalveolar lavage with 50 cc of normal saline instilled  and approximately 15 to 20 cc returned was performed from the apical segment of the right upper lobe.  This will be sent for microbiology.  The fluid was bloody.  Even after the BAL there was no evidence of bleeding from these airways.  The patient tolerated the procedure well. The bronchoscope was removed. There were no obvious complications.   Samples: BAL from the apical segment of the right upper lobe for microbiology  Plans:  We will review the microbiology results with the patient and family when they become available.    Baltazar Apo, MD, PhD 01/13/2019, 3:13 PM [facility] [phone] or if no answer [phone]

## 2019-01-13 NOTE — Progress Notes (Signed)
Intubation procedure:  50 mcg fentanyl at 1228 Flush 10 mL 1mg  versed at 1229 Flush 20mg  Etomidate at 1232 flush  ETT tube inserted 1235 Bilateral lung sounds at 1237  Central line placement:  precedex resumed at 0.8 17mcg fentanyl at 1256

## 2019-01-13 NOTE — Progress Notes (Signed)
[facility] Progress Note Patient Name: Tanner Cox. DOB: September 16, 1931 MRN: [id number]   Date of Service  01/13/2019  HPI/Events of Note  NIBP 220/184, pt is not agitated. His mental status is altered but unchanged from baseline at the beginning of the shift.  eICU Interventions  Cardene gtt, arterial line for close BP monitoring, stat head CT r/o ICH vs CVA        Tanner Cox 01/13/2019, 3:27 AM

## 2019-01-13 NOTE — Progress Notes (Signed)
[place] Progress Note Patient Name: Tanner Cox. DOB: [birth year] MRN: [id number]   Date of Service  01/13/2019  HPI/Events of Note  Pt with some blood in urine, hemoglobin marginally lower than baseline, pt is not on anti-coagulation.  eICU Interventions  Repeat hemoglobin at noon        Okoronkwo U Ogan 01/13/2019, 4:42 AM

## 2019-01-13 NOTE — Progress Notes (Signed)
Holding CPT until central line is placed. Will attempt at later time.

## 2019-01-13 NOTE — Progress Notes (Signed)
Assisted with intubation. Pt preoxygenated with 100% O2. Pt intubated with 8 tube at 25 lips. Confirmed with EZCAP and bilateral breath sounds.

## 2019-01-13 NOTE — Progress Notes (Signed)
Discussed plan of care at length w/ pt's wife and daughters.   Plan as outlined below and per our progress note.  Specifically: Continue full scope of medical care including ventilatory support, BAL, antibiotics, central access and antiarrythmic drugs as indicated.  They all agree to DNR w/ No CPR should Mr Goodrich suffer a cardiac arrest.   We will push aggressive cart up to Friday looking for some positive signs of improvement. If he is progressing we will continue care. If not they will consider transitioning to comfort if no improvement.   Erick Colace ACNP-BC [place] Pager # [phone] OR # [phone] if no answer

## 2019-01-13 NOTE — Progress Notes (Signed)
CPT held due to bright, red blood and clots while suctioning. RT will reassess at next scheduled time.

## 2019-01-13 NOTE — Progress Notes (Signed)
[facility] Progress Note Patient Name: Tanner Cox. DOB: 06-04-1932 MRN: [id number]   Date of Service  01/13/2019  HPI/Events of Note  Pt needs some of his scheduled oral medications switched to via  Feeding tube  eICU Interventions  Tramadol d/c'd. CellCept, changed to via Feeding tube. Melatonin substituted.        Kerry Kass Ogan 01/13/2019, 10:57 PM

## 2019-01-13 NOTE — Progress Notes (Addendum)
Patient decompensated overnight and had to have several eICU interventions  done Dr. Trevor Mace. Patient had an arterial line placed and was placed on a Cardene drip along with a Precedex drip PRN. He also underwent a Head CT for his Encephalopathy and Delirium and after my discussion with the Critical Care nurse practitioner they will see the patient today and TRH will not see the patient today.  TRH will pick the patient back up and manage when patient is more stable and off of Precedex and the Nicardipine drip.

## 2019-01-13 NOTE — Progress Notes (Signed)
ABG drawn post Vt to 6cc and RR change to match Ve on 7cc. Per ABG results in Epic, no changes to be made at this time to Vt and/or RR. PEEP reduced to +5 at this time due to PO2 of 100 and SpO2 of 97% on ABG. RT will continue to monitor patient.

## 2019-01-13 NOTE — Progress Notes (Signed)
[facility] Progress Note Patient Name: Tanner Cox. DOB: 08-19-1931 MRN: [id number]   Date of Service  01/13/2019  HPI/Events of Note  Pt is going to CT for a head scan and needs some sedation for the trip  eICU Interventions  Precedex infusion 0.2-0.6 prn agitation        Loreto Loescher U Yvett Rossel 01/13/2019, 4:59 AM

## 2019-01-13 NOTE — Progress Notes (Addendum)
NAME:  Rolland Steinert., MRN:  [id number], DOB:  02/14/1932, LOS: 5 ADMISSION DATE:  01/27/2019, CONSULTATION DATE:  01/23/2019 REFERRING MD:  Horris Latino, MD CHIEF COMPLAINT:  Shortness of breath, hemoptysis  Brief History   Mr. Echavarria Junior is a 85 year old male with pulmonary fibrosis on CellCept and steroids who presents with chills, shortness of breath and increased home oxygen requirement.  PCCM consulted for small volume hemoptysis.  Past Medical History  COPD, AAA, CAD, Psoriatec arthritis, GERD, nephrolithiasis status post recent stent  Significant Hospital Events   7/10 admitted to [facility] 7/11 Near cardiac arrest, SVT, altered mental status, Seen by [facility] for recommendation on ILD flare, steroids started 7/12 Mild delirium first noted by family 7/13-7/14: Delirium persist 7/15 Overnight increasing O2 requirements, agitation, hypertensive crisis, CTH negative, new hematuria, labile BP  Consults:  PCCM  Procedures:    Significant Diagnostic Tests:  CTA C/A/P 7/8: Emphysematous changes with peripheral reticular opacities CT Head 7/15: Negative  Micro Data:  COVID-19 7/10 - neg MRSA by PCR 7/11 - neg BCX 7/10 >> UCx 7/10 - neg  Antimicrobials:  Cefepime 7/10>> Doxycycline 7/14 >>   Interim history/subjective:  Overnight patient became increasing agitated, hypoxic and hypertensive. - Over the last 24 hours, O2 requirement has increased from 2L [place] to NRB. AM ABG shows pO2 81% on 60% fiO2. Now requiring a NRB  - Hypertensive SBP 200's. Requiring an arterial line and Cardene drip for BP control.  - PRN precedex drip added for increasing agitation. Head CT performed for encephalopathy/ delirium and negative for ICH or CVA.  - New hematuria, not anticoagulated, renal stent still in place.   Objective   Blood pressure (Abnormal) 84/39, pulse 100, temperature 98.1 F (36.7 C), temperature source Axillary, resp. rate (Abnormal) 38, height 6' (1.829 m), weight 102.9 kg, SpO2 96  %.    FiO2 (%):  [100 %] 100 %   Intake/Output Summary (Last 24 hours) at 01/13/2019 0102 Last data filed at 01/13/2019 0900 Gross per 24 hour  Intake 747.44 ml  Output 1125 ml  Net -377.56 ml   Filed Weights   01/23/2019 2127 01/11/19 0500  Weight: 106.1 kg 102.9 kg    Physical Exam: GEN: Obtunded 85 year old male HEENT: Moist mucous membranes CV: Irregular, no murmurs, gallops, or rubs PULM: Diminished throughout GI: Soft, non-tender, active bowel sounds EXT: No edema, good cap refill, palpable pulses NEURO: Obtunded, does not open eyes to commands, withdraws to pain on all extremities, on 0.6 of dex during assessment  PSYCH: Intermittent agitation  SKIN: warm, dry, intact   Resolved Hospital Problem list   Hemoptysis  Assessment & Plan:   Acute on chronic hypoxemic respiratory failure, ILD exacerbation with small volume hemopytsis/ Now with progressive respiratory failure - aspiration event vs progression of ILD vs cardiogenic pulm edema: - Last ILD exacerbation: March 2020. Procal negative  Plan: - Continue bronchodilator - Will discuss continuing cellcept with Dr. Lamonte Sakai, may need to hold this if we truly think this is infection - Wean high-dose steroid taper d/t delirium - Wean O2 to maintain sat > 92% - If intubated, bronch/ BAL - Family discussion decide whether to intubate vs conservative measures. Have recommended DNR.  - Give the degree of hypoxia and marginal CXR changes, may consider repeat VTE exam  Paroxysmal Afib: Plan: - Restart scheduled IV metoprolol 5 mg q6h, hold if needed - Avoid amiodarone d/t advanced ILD - Hold anticoagulation until after scheduled urological procedure - F/u with outpatient  cardiology  Leukocytosis (on methylprednisolone):  Plan: - Continue doxycycline - Add Zosyn, Vanc  Hypertensive Crisis: Labile BP, CTH negative   Plan: - Cardene gtt to maintain SBP < 180  - Phenylephrine to maintain SBP > 90  L kidney stone s/p  stenting, R atrophied kidney - New hematuria 7/15 Plan: - Trend CBC - Monitor urine output and color for now  CKD, stage III: - Improving Cr 1.7 (downtrending) - Dialysis stopped 7/12 Plan: - Avoid nephrotoxic agents - Pending CXR, may need diuresis  Delirium/ ETOH withdrawal/ possibly exacerbated by steroids/ consider infectious process or hypoxia: Plan: - Continue folic acid, vitamin B-1 - PRN haldol, qHS [place] - Family allows to stay in hospital   Best practice:  Diet: NPO Pain/Anxiety/Delirium protocol (if indicated): pending intubation VAP protocol (if indicated): pending intubation DVT prophylaxis: SCDs GI prophylaxis: PPI Glucose control: N/A Mobility: Bed rest Code Status: FULL Family Communication: 7/15 daughters Disposition: ICU  Critical Care time: 40 min  Erick Colace ACNP-BC [place] Pager # [phone] OR # [phone] if no answer

## 2019-01-13 NOTE — Procedures (Signed)
Central Venous Catheter Insertion Procedure Note Eian Vandervelden [id number] [birth year]  Procedure: Insertion of Central Venous Catheter Indications: Assessment of intravascular volume, Drug and/or fluid administration and Frequent blood sampling  Procedure Details Consent: Risks of procedure as well as the alternatives and risks of each were explained to the (patient/caregiver).  Consent for procedure obtained. Time Out: Verified patient identification, verified procedure, site/side was marked, verified correct patient position, special equipment/implants available, medications/allergies/relevent history reviewed, required imaging and test results available.  Performed Real time [place] used to ID and cannulate vessel  Maximum sterile technique was used including antiseptics, cap, gloves, gown, hand hygiene, mask and sheet. Skin prep: Chlorhexidine; local anesthetic administered A antimicrobial bonded/coated triple lumen catheter was placed in the right internal jugular vein using the Seldinger technique.  Evaluation Blood flow good Complications: No apparent complications Patient did tolerate procedure well. Chest X-ray ordered to verify placement.  CXR: pending.  Clementeen Graham 01/13/2019, 1:46 PM  Erick Colace ACNP-BC [place] Pager # [phone] OR # [phone] if no answer

## 2019-01-13 NOTE — Progress Notes (Signed)
Pharmacy Antibiotic Note  Tanner Cox. is a 85 y.o. male admitted on 01/26/2019 with severe abd pain.  Pharmacy has been consulted for vancomycin and zosyn dosing for aspiration PNA.   Plan: Zosyn 3.375g IV q8h (4 hour infusion).  Vancomycin 2000 mg IV loading dose Vancomycin 1250 mg IV Q 24 hrs. Goal AUC 400-550. Expected AUC: 518.4 Cmax 31.1/Cmin 15 SCr used: 1.75 Vd 0.72 F/u renal function, WBC, temp, culture data Vancomycin levels as needed Daily SCr while on both vanc and zosyn - has daily BMET for now  Height: 6' (182.9 cm) Weight: 226 lb 13.7 oz (102.9 kg) IBW/kg (Calculated) : 77.6  Temp (24hrs), Avg:98.6 F (37 C), Min:98.1 F (36.7 C), Max:100.2 F (37.9 C)  Recent Labs  Lab 01/15/2019 1157 01/09/19 0315 01/10/19 0256 01/11/19 0219 01/12/19 0221 01/13/19 0227  WBC 8.5 4.5  --  13.2* 12.7* 10.9*  CREATININE 1.79* 1.78* 2.01* 1.75* 1.76* 1.72*  LATICACIDVEN 0.9  --   --   --   --   --     Estimated Creatinine Clearance: 38.9 mL/min (A) (by C-G formula based on SCr of 1.72 mg/dL (H)).    Allergies  Allergen Reactions  . Oxycodone-Acetaminophen Nausea And Vomiting  . Sulfa Antibiotics Other (See Comments)    Per wife, pt has a hypersensitivity  . Codeine Rash  . Escitalopram Hives and Rash  . Sulfamethoxazole-Trimethoprim Nausea Only   Antimicrobials this admission: 7/10 cefepime >> 7/11 7/8 ancef x 1 7/11 doxy>> missed all doses 7/12 & 7/13>> 7/15 7/11 azith x 1 7/15 Vanc>> 7/15 zosyn >> Dose adjustments this admission:  Microbiology results: 7/8 MRSA PCR neg 7/11 MRSA PCR neg 7/11 UCx NGF 7/10 BCx2: ngtd 7/15 resp cx BAL: sent 7/15 AFB x 2: sent 7/15 fungus cx: sent  Thank you for allowing pharmacy to be a part of this patient's care.  Eudelia Bunch, Pharm.D [phone] 01/13/2019 4:14 PM

## 2019-01-13 NOTE — Procedures (Signed)
Arterial Catheter Insertion Procedure Note Tanner Cox [id number] 1932/03/13  Procedure: Insertion of Arterial Catheter  Indications: Blood pressure monitoring  Procedure Details Consent: Unable to obtain consent because of altered level of consciousness. Time Out: Verified patient identification, verified procedure, site/side was marked, verified correct patient position, special equipment/implants available, medications/allergies/relevent history reviewed, required imaging and test results available.  Performed  Maximum sterile technique was used including antiseptics, cap, gloves, gown, hand hygiene, mask and sheet. Skin prep: Chlorhexidine; local anesthetic administered 20 gauge catheter was inserted into left radial artery using the Seldinger technique. ULTRASOUND GUIDANCE USED: NO Evaluation Blood flow good; BP tracing good. Complications: No apparent complications.   Rosann Auerbach 01/13/2019

## 2019-01-13 NOTE — Procedures (Addendum)
Intubation Procedure Note Tanner Cox [id number] [birth year]  Procedure: Intubation Indications: Respiratory insufficiency  Procedure Details Consent: Risks of procedure as well as the alternatives and risks of each were explained to the (patient/caregiver).  Consent for procedure obtained. Time Out: Verified patient identification, verified procedure, site/side was marked, verified correct patient position, special equipment/implants available, medications/allergies/relevent history reviewed, required imaging and test results available.  Performed  Maximum sterile technique was used including antiseptics, cap, gloves, gown, hand hygiene, mask and sheet.  4  4 glide w/ 8 ETT  Evaluation Hemodynamic Status: BP stable throughout; O2 sats: stable throughout Patient's Current Condition: stable Complications: No apparent complications Patient did tolerate procedure well. Chest X-ray ordered to verify placement.  CXR: pending.  During intubation, there was a visualization of a large thrombus occluding the airway. Post-intubation thrombus was removed using the glidescope.  Clementeen Graham 01/13/2019  Erick Colace ACNP-BC [place] Pager # [phone] OR # [phone] if no answer

## 2019-01-14 ENCOUNTER — Inpatient Hospital Stay (HOSPITAL_COMMUNITY): Payer: Medicare Other

## 2019-01-14 DIAGNOSIS — J181 Lobar pneumonia, unspecified organism: Secondary | ICD-10-CM

## 2019-01-14 DIAGNOSIS — J189 Pneumonia, unspecified organism: Secondary | ICD-10-CM

## 2019-01-14 LAB — GLUCOSE, CAPILLARY
Glucose-Capillary: 142 mg/dL — ABNORMAL HIGH (ref 70–99)
Glucose-Capillary: 117 mg/dL — ABNORMAL HIGH (ref 70–99)
Glucose-Capillary: 132 mg/dL — ABNORMAL HIGH (ref 70–99)
Glucose-Capillary: 138 mg/dL — ABNORMAL HIGH (ref 70–99)
Glucose-Capillary: 146 mg/dL — ABNORMAL HIGH (ref 70–99)
Glucose-Capillary: 112 mg/dL — ABNORMAL HIGH (ref 70–99)

## 2019-01-14 LAB — BLOOD GAS, ARTERIAL
Acid-Base Excess: 1.1 mmol/L (ref 0.0–2.0)
Bicarbonate: 25.3 mmol/L (ref 20.0–28.0)
Drawn by: 441261
FIO2: 40
MECHVT: 460 mL
O2 Saturation: 88.4 %
PEEP: 5 cmH2O
Patient temperature: 98.6
RATE: 28 resp/min
pCO2 arterial: 41.2 mmHg (ref 32.0–48.0)
pH, Arterial: 7.405 (ref 7.350–7.450)
pO2, Arterial: 57.8 mmHg — ABNORMAL LOW (ref 83.0–108.0)

## 2019-01-14 LAB — MAGNESIUM
Magnesium: 2.4 mg/dL (ref 1.7–2.4)
Magnesium: 2.5 mg/dL — ABNORMAL HIGH (ref 1.7–2.4)

## 2019-01-14 LAB — CBC
HCT: 30 % — ABNORMAL LOW (ref 39.0–52.0)
Hemoglobin: 8.7 g/dL — ABNORMAL LOW (ref 13.0–17.0)
MCH: 30.3 pg (ref 26.0–34.0)
MCHC: 29 g/dL — ABNORMAL LOW (ref 30.0–36.0)
MCV: 104.5 fL — ABNORMAL HIGH (ref 80.0–100.0)
Platelets: 189 10*3/uL (ref 150–400)
RBC: 2.87 MIL/uL — ABNORMAL LOW (ref 4.22–5.81)
RDW: 14.4 % (ref 11.5–15.5)
WBC: 10.7 10*3/uL — ABNORMAL HIGH (ref 4.0–10.5)
nRBC: 0 % (ref 0.0–0.2)

## 2019-01-14 LAB — BASIC METABOLIC PANEL
Anion gap: 9 (ref 5–15)
BUN: 55 mg/dL — ABNORMAL HIGH (ref 8–23)
CO2: 24 mmol/L (ref 22–32)
Calcium: 8.6 mg/dL — ABNORMAL LOW (ref 8.9–10.3)
Chloride: 112 mmol/L — ABNORMAL HIGH (ref 98–111)
Creatinine, Ser: 1.76 mg/dL — ABNORMAL HIGH (ref 0.61–1.24)
GFR calc Af Amer: 40 mL/min — ABNORMAL LOW (ref >60)
GFR calc non Af Amer: 34 mL/min — ABNORMAL LOW (ref >60)
Glucose, Bld: 165 mg/dL — ABNORMAL HIGH (ref 70–99)
Potassium: 4 mmol/L (ref 3.5–5.1)
Sodium: 145 mmol/L (ref 135–145)

## 2019-01-14 LAB — TRIGLYCERIDES: Triglycerides: 127 mg/dL (ref <150)

## 2019-01-14 LAB — PHOSPHORUS
Phosphorus: 3.3 mg/dL (ref 2.5–4.6)
Phosphorus: 3.5 mg/dL (ref 2.5–4.6)

## 2019-01-14 LAB — ACID FAST SMEAR (AFB, MYCOBACTERIA): Acid Fast Smear: NEGATIVE

## 2019-01-14 LAB — VITAMIN B12: Vitamin B-12: 915 pg/mL — ABNORMAL HIGH (ref 180–914)

## 2019-01-14 MED ORDER — METOPROLOL TARTRATE 5 MG/5ML IV SOLN
5.0000 mg | INTRAVENOUS | Status: DC | PRN
  Administered 2019-01-15 – 2019-01-19 (×7): 5 mg via INTRAVENOUS
  Filled 2019-01-14 (×8): qty 5

## 2019-01-14 MED ORDER — VITAMIN B-1 100 MG PO TABS
100.0000 mg | ORAL_TABLET | Freq: Every day | ORAL | Status: DC
  Administered 2019-01-14 – 2019-01-15 (×2): 100 mg
  Filled 2019-01-14 (×2): qty 1

## 2019-01-14 MED ORDER — PROPOFOL 1000 MG/100ML IV EMUL
5.0000 ug/kg/min | INTRAVENOUS | Status: DC
  Administered 2019-01-14: 17:00:00 30 ug/kg/min via INTRAVENOUS
  Administered 2019-01-14: 25 ug/kg/min via INTRAVENOUS
  Administered 2019-01-14: 5 ug/kg/min via INTRAVENOUS
  Administered 2019-01-15 (×2): 35 ug/kg/min via INTRAVENOUS
  Filled 2019-01-14 (×6): qty 100

## 2019-01-14 MED ORDER — PANTOPRAZOLE SODIUM 40 MG PO PACK
40.0000 mg | PACK | Freq: Every day | ORAL | Status: DC
  Administered 2019-01-14 – 2019-01-15 (×2): 40 mg
  Filled 2019-01-14 (×2): qty 20

## 2019-01-14 MED ORDER — FREE WATER
100.0000 mL | Status: DC
  Administered 2019-01-14 – 2019-01-15 (×6): 100 mL

## 2019-01-14 MED ORDER — FREE WATER: 200.0000 mL | Freq: Three times a day (TID) | Status: DC

## 2019-01-14 MED ORDER — VITAL HIGH PROTEIN PO LIQD: 1000.0000 mL | ORAL | Status: DC

## 2019-01-14 MED ORDER — PRO-STAT SUGAR FREE PO LIQD
60.0000 mL | Freq: Four times a day (QID) | ORAL | Status: DC
  Administered 2019-01-14 – 2019-01-15 (×4): 60 mL
  Filled 2019-01-14 (×4): qty 60

## 2019-01-14 MED ORDER — VITAL HIGH PROTEIN PO LIQD
1000.0000 mL | ORAL | Status: DC
  Administered 2019-01-14: 1000 mL

## 2019-01-14 MED ORDER — ADULT MULTIVITAMIN LIQUID CH
15.0000 mL | Freq: Every day | ORAL | Status: DC
  Administered 2019-01-14 – 2019-01-15 (×2): 15 mL
  Filled 2019-01-14 (×2): qty 15

## 2019-01-14 NOTE — Progress Notes (Signed)
Initial Nutrition Assessment  DOCUMENTATION CODES:   Obesity unspecified  INTERVENTION:  - will adjust TF regimen: Vital High Protein @ 20 ml/hr with 60 ml prostat QID and 100 ml free water every 4 hours. - this regimen + kcal from current TF regimen will provide 1605 kcal (111% estimated kcal need), 162 grams protein, and 1001 ml free water. - will order 15 ml liquid multivitamin per OGT/day.  - will continue to monitor propofol rate/changes.   NUTRITION DIAGNOSIS:   Inadequate oral intake related to inability to eat as evidenced by NPO status.  GOAL:   Provide needs based on ASPEN/SCCM guidelines  MONITOR:   Vent status, TF tolerance, Labs, Weight trends  REASON FOR ASSESSMENT:   Ventilator, Consult Enteral/tube feeding initiation and management  ASSESSMENT:   85 year old male with multiple comorbidities including history of HTN, COPD with pulmonary fibrosis, chronic hypoxic failure on 3-4 L of O2 at home, AAA, CAD, nephrolithiasis, recent left ureteric stent placement and planned staged procedure by urology, psoriatic arthritis, GERD. He presented to the ED with complaints of worsening SOB, productive cough, and one episode of blood-tinged sputum. He was found to have exacerbation of underlying interstitial lung disease. COVID-19 negative.  Patient was intubated and OGT placed yesterday afternoon. Patient underwent bronch following intubation. Patient is currently receiving TF per TF protocol: Vital High Protein @ 40 ml/hr with 30 ml prostat BID. This regimen is providing 1160 kcal, 114 grams of protein, and 802 ml free water.  Able to talk with patient's wife at bedside. She reports that patient had throat cancer 15 years ago and during that time had some difficulty swallowing but that this is no longer an issue for him and he has no difficulties with chewing.    She states that about 2 months ago patient began to have decreased appetite while he was on a medication and that  after the d/c of this medication, appetite began to return. Yet, for the 7-10 days PTA patient had a decreased appetite again and for the 5 days prior to yesterday (when TF was started) patient had had no nutrition. She states that confusion is not a new thing for patient, that it has been ongoing for several months and that patient, wife, and PCP have been trying to determine the cause for this. She states that patient is aware of confusion and that he is confused and that it does not negatively impact ADLs.   Wife reports that it was determined that patient had a kidney stone and that he was having a great deal of pain from this. She feels this negatively impacted PO intakes PTA.   Per chart review, weight on 7/13 (used to estimate kcal need) was 227 lb. [facility] 237 lb. This indicates 10 lb weight loss (4% body weight) in the past 3 weeks. Wife initially denies any weight changes but then goes on to state patient has lost 10 lb in the past month. Will monitor weight closely.   Per notes:  - acute on chronic hypoxemic respiratory failure, progressive respiratory failure - aspiration event vs progression of interstitial lung disease vs cardiogenic pulmonary edema - afib - leukocytosis - hypertensive crisis  - L kidney stone s/p stenting, atrophy to R kidney - CKD stage 3 with CXR pending and possible need for diuresis - delirium, alcohol withdrawal, possible exacerbation by steroids, or possible infectious process or d/t hypoxia   Patient is currently intubated on ventilator  support MV: 14.3 L/min Temp (24hrs), Avg:98 F (36.7 C), Min:97.6 F (36.4 C), Max:98.4 F (36.9 C) Propofol: 12.3 ml/hr (325 kcal) BP: 181/75 and MAP: 118  Medications reviewed; 1 mg folvite/day, 20 mg solu-medrol/day, 1 packet miralax/day, 2 tablets senokot BID, 100 mg thiamine per OGT/day. Labs reviewed; CBGs: 142 and 117 mg/dl today, Cl: 112 mmol/l, BUN: 55 mg/dl,  creatinine: 1.76 mg/dl, Ca: 8.6 mg/dl, GFR: 34 ml/min. Drips; propofol @ 20 mcg/kg/min, neo @ 25 mcg/min.     NUTRITION - FOCUSED PHYSICAL EXAM:  completed; no muscle and no fat wasting, no edema noted at this time.  Diet Order:   Diet Order            Diet NPO time specified  Diet effective now              EDUCATION NEEDS:   No education needs have been identified at this time  Skin:  Skin Assessment: Reviewed RN Assessment  Last BM:  PTA/unknown  Height:   Ht Readings from Last 1 Encounters:  01/13/19 6' (1.829 m)    Weight:   Wt Readings from Last 1 Encounters:  01/11/19 102.9 kg    Ideal Body Weight:  80.9 kg  BMI:  Body mass index is 30.77 kg/m.  Estimated Nutritional Needs:   Kcal:  1132-1441 kcal  Protein:  >/= 162 grams  Fluid:  >/= 1.8 L/day     Jarome Matin, MS, RD, LDN, [facility] Inpatient Clinical Dietitian Pager # [phone] After hours/weekend pager # [phone]

## 2019-01-14 NOTE — Progress Notes (Signed)
NAME:  Dunbar Buras., MRN:  [id number], DOB:  06-02-1932, LOS: 6 ADMISSION DATE:  01/25/2019, CONSULTATION DATE:  01/04/2019 REFERRING MD:  Horris Latino, MD CHIEF COMPLAINT:  Shortness of breath, hemoptysis  Brief History   Mr. Hammen Junior is a 85 year old male with pulmonary fibrosis on CellCept and steroids who presents with chills, shortness of breath and increased home oxygen requirement.  PCCM consulted for small volume hemoptysis.  Past Medical History  COPD, AAA, CAD, Psoriatec arthritis, GERD, nephrolithiasis status post recent stent  Significant Hospital Events   7/10 admitted to [facility] 7/11 Near cardiac arrest, SVT, altered mental status, Seen by [facility] for recommendation on ILD flare, steroids started 7/12 Mild delirium first noted by family 7/13-7/14: Delirium persist 7/15 Overnight increasing O2 requirements, agitation, hypertensive crisis, CTH negative, new hematuria, labile BP 7/16 Bradycardia, precedex d/c'd. FIO2/PEEP decreased. CXR a little worse but may be volume loss from low tidal vol ventilation  Consults:  PCCM  Procedures:    Significant Diagnostic Tests:  CTA C/A/P 7/8: Emphysematous changes with peripheral reticular opacities CT Head 7/15: Negative  Micro Data:  COVID-19 7/10 - neg MRSA by PCR 7/11 - neg BCX 7/10 >> UCx 7/10 - neg Sputum 7/15 >>  Antimicrobials:  Cefepime 7/10>> Doxycycline 7/14 >> 7/15 Zosyn 7/15 >> Vanc 7/15 >>   Interim history/subjective:  - Intubated, found a large thrombus in his airway from previous hemoptysis. Bronch and BAL sent. No evidence of active bleeding seen during bronch.  - Overnight: Bradycardiac, Precedex d/c'd    Objective   Blood pressure (Abnormal) 102/47, pulse 93, temperature 98.1 F (36.7 C), temperature source Oral, resp. rate (Abnormal) 22, height 6' (1.829 m), weight 102.9 kg, SpO2 95 %. CVP:  [5 mmHg] 5 mmHg  Vent Mode: PRVC FiO2 (%):  [40 %-100 %] 40 % Set Rate:  [16 bmp-28 bmp] 28 bmp Vt Set:   [460 mL-620 mL] 460 mL PEEP:  [5 cmH20-10 cmH20] 5 cmH20 Plateau Pressure:  [12 cmH20-27 cmH20] 16 cmH20   Intake/Output Summary (Last 24 hours) at 01/14/2019 0824 Last data filed at 01/14/2019 0438 Gross per 24 hour  Intake 1333.43 ml  Output 1025 ml  Net 308.43 ml   Filed Weights   01/02/2019 2127 01/11/19 0500  Weight: 106.1 kg 102.9 kg    Physical Exam: GEN: This is an obese 85 year old male patient who is sedated on the mechanical ventilator but still wakes up.  He is on a very low dose of phenylephrine HEENT: Normocephalic atraumatic orally intubated no JVD sclera nonicteric  CV: Intermittent fibrillation with rapid ventricular response no murmur rub or gallop PULM: Diminished bases no accessory use GI: Obese soft nontender positive bowel sounds tolerating tube feeds EXT: Trace lower extremity edema brisk capillary refill strong pulses NEURO: Opens eyes to gentle verbal stimulus moves all extremities SKIN: Warm and dry   Resolved Hospital Problem list   Hemoptysis, Hypertensive Crisis  Assessment & Plan:   Acute on chronic hypoxemic respiratory failure, ILD exacerbation with small volume hemopytsis/ Now with progressive respiratory failure - aspiration event vs progression of ILD vs cardiogenic pulm edema: -Portable chest x-ray personally reviewed this demonstrates endotracheal tube in satisfactory position, central line in satisfactory position, feeding tube coursing below the level of the diaphragm.  Comparing films from 7/15 there is slight increase in alveolar and interstitial infiltrate left greater than right.  Difficult to exclude element of pleural effusion on the left.  Unclear how much low tidal volume affecting this -PEEP/FiO2 dramatically diminished  Plan: Continuing full ventilatory support with ARDS protocol Keep even volume status at this point Holding Solu-Medrol at current dosing of 20 mg daily Follow-up bronchial alveolar lavage Day #2 vancomycin and Zosyn,  we will likely be able to discontinue vancomycin We will hold CellCept given concern for acute infection PAD protocol with RASS goal of -2 VAP bundle  Paroxysmal Afib with intermittent rapid ventricular response and then also intermittent bradycardia Plan: We will change Lopressor to PRN Continue telemetry monitoring Avoid volume depletion Holding off on anticoagulation given recent hemoptysis  Leukocytosis (on methylprednisolone):  Plan: Trend CBC  Hypotension.  This seems likely drug-induced from sedation Plan: Continue phenylephrine with a mean arterial pressure goal greater than 65  L kidney stone s/p stenting, R atrophied kidney CKD, stage III: -He has had marginal bump in serum creatinine but seems to be holding 1.7 Plan: Avoid hypotension Renal dose medications Strict intake output Holding diuresis as current central venous pressures 2  Delirium/ ETOH withdrawal/ possibly exacerbated by steroids/ consider infectious process or hypoxia: Plan: Continuing folic acid and vitamin V  Frequent reorientation  At bedtime melatonin  PAD protocol   Best practice:  Diet: NPO Pain/Anxiety/Delirium protocol (if indicated): 7/15 initiated  VAP protocol (if indicated): 7/15 initiated   DVT prophylaxis: SCDs GI prophylaxis: PPI Glucose control: CBG q4h Mobility: Bed rest Code Status: DNR Family Communication: Updated 7/16   Disposition: ICU  Critical Care time: 56mn  Patient remains critically ill due to the progression of his chronic interstitial lung disease. Requiring vasopressor management, mechanical ventilatory support, fluid and electrolyte management.  Plan discussed with family and agreed on short-term intubation and mechanical ventilation to achieve stability and to see if this is a reversible process overnight. On 7/17, plan to extubate.   PErick ColaceACNP-BC LHood RiverPager # [phone] # [phone] no answer    PErick Colace ACNP-BC [place]Pager # [phone] # [phone] no answer

## 2019-01-14 NOTE — Progress Notes (Signed)
[facility] Progress Note Patient Name: Tanner Cox. DOB: 12-29-1931 MRN: [id number]   Date of Service  01/14/2019  HPI/Events of Note  Pt is bradycardic on Demedotomidine  eICU Interventions  D/c Dex, start Propofol, check triglycerides        Okoron[place]wo U Ogan 01/14/2019, 4:00 AM

## 2019-01-15 ENCOUNTER — Inpatient Hospital Stay (HOSPITAL_COMMUNITY): Payer: Medicare Other

## 2019-01-15 LAB — CBC
HCT: 31.9 % — ABNORMAL LOW (ref 39.0–52.0)
Hemoglobin: 9.4 g/dL — ABNORMAL LOW (ref 13.0–17.0)
MCH: 30.9 pg (ref 26.0–34.0)
MCHC: 29.5 g/dL — ABNORMAL LOW (ref 30.0–36.0)
MCV: 104.9 fL — ABNORMAL HIGH (ref 80.0–100.0)
Platelets: 186 10*3/uL (ref 150–400)
RBC: 3.04 MIL/uL — ABNORMAL LOW (ref 4.22–5.81)
RDW: 14.5 % (ref 11.5–15.5)
WBC: 10.1 10*3/uL (ref 4.0–10.5)
nRBC: 0 % (ref 0.0–0.2)

## 2019-01-15 LAB — BASIC METABOLIC PANEL
Anion gap: 7 (ref 5–15)
BUN: 66 mg/dL — ABNORMAL HIGH (ref 8–23)
CO2: 25 mmol/L (ref 22–32)
Calcium: 8.9 mg/dL (ref 8.9–10.3)
Chloride: 117 mmol/L — ABNORMAL HIGH (ref 98–111)
Creatinine, Ser: 1.84 mg/dL — ABNORMAL HIGH (ref 0.61–1.24)
GFR calc Af Amer: 38 mL/min — ABNORMAL LOW (ref >60)
GFR calc non Af Amer: 33 mL/min — ABNORMAL LOW (ref >60)
Glucose, Bld: 120 mg/dL — ABNORMAL HIGH (ref 70–99)
Potassium: 4.6 mmol/L (ref 3.5–5.1)
Sodium: 149 mmol/L — ABNORMAL HIGH (ref 135–145)

## 2019-01-15 LAB — GLUCOSE, CAPILLARY
Glucose-Capillary: 97 mg/dL (ref 70–99)
Glucose-Capillary: 128 mg/dL — ABNORMAL HIGH (ref 70–99)
Glucose-Capillary: 130 mg/dL — ABNORMAL HIGH (ref 70–99)
Glucose-Capillary: 113 mg/dL — ABNORMAL HIGH (ref 70–99)

## 2019-01-15 MED ORDER — DEXMEDETOMIDINE HCL IN NACL 200 MCG/50ML IV SOLN
0.0000 ug/kg/h | INTRAVENOUS | Status: DC
  Administered 2019-01-15 – 2019-01-18 (×8): 0.4 ug/kg/h via INTRAVENOUS
  Filled 2019-01-15: qty 50
  Filled 2019-01-15: qty 100
  Filled 2019-01-15: qty 50
  Filled 2019-01-15: qty 100
  Filled 2019-01-15 (×9): qty 50

## 2019-01-15 MED ORDER — FOLIC ACID 5 MG/ML IJ SOLN
1.0000 mg | Freq: Every day | INTRAMUSCULAR | Status: DC
  Administered 2019-01-16 – 2019-01-26 (×11): 1 mg via INTRAVENOUS
  Filled 2019-01-15 (×13): qty 0.2

## 2019-01-15 MED ORDER — FREE WATER
300.0000 mL | Status: DC
  Administered 2019-01-15: 300 mL

## 2019-01-15 MED ORDER — LABETALOL HCL 5 MG/ML IV SOLN
20.0000 mg | INTRAVENOUS | Status: AC | PRN
  Administered 2019-01-15 – 2019-01-16 (×10): 20 mg via INTRAVENOUS
  Filled 2019-01-15 (×10): qty 4

## 2019-01-15 MED ORDER — THIAMINE HCL 100 MG/ML IJ SOLN
100.0000 mg | Freq: Every day | INTRAMUSCULAR | Status: DC
  Administered 2019-01-16 – 2019-01-26 (×11): 100 mg via INTRAVENOUS
  Filled 2019-01-15 (×11): qty 2

## 2019-01-15 NOTE — Progress Notes (Signed)
Around 1706 pt started to become verbally aggressive and cussing at staff. Pt continually pulling at [facility] and A line so this RN used redirection to keep from placing mitts back on and notified CN of pt needing a Air cabin crew.  Pt able to initially be redirected but became increasingly agitated when he was reminded to not pull on his tubings.  Pt started spitting on the floor, bed and himself.  Pt cleaned up and was started on Precedex again for agitation after trying to hit this RN when assisting to clean up a bowel movement.  Pt reminded not to strike at staff and pt started to calm down.  Pt was being cleaned up by this RN and another RN after having another BM and pt became more verbally aggressive to staff when helping him turn.  Again pt was reminded where he was and what staff was attempting to do at which time pt again settled down.  When second RN left room, this RN went to help pt as he was about to spit and pt abruptly threw his right arm towards this RN and yelled.  This RN stepped out of the way and called for assistance from other staff to help redirect and page MD. Dr. Lamonte Sakai paged at [phone] to make aware of pts aggression verbally and physically.  Awaiting call back and will continue to monitor.

## 2019-01-15 NOTE — Progress Notes (Signed)
Post extubation evaluation  Interval: Since extubation precedex off. Still requiring sig amount of supplemental oxygen (high flow 60% on 40 lpm flow), but mental status improved and in no distress.   A/P Slowly improving Tolerating extubation -cont high flow; titrate for sats >88% -cont abx (planning for 7d) -rx delirium (trying off precedex but will leave this on med profile w/ ceiling of 0.24mcg/min -fu cxr in am  Family updated  He would be re-intubation of required.   Erick Colace ACNP-BC [place] Pager # [phone] OR # [phone] if no answer

## 2019-01-15 NOTE — Progress Notes (Signed)
NAME:  Tanner Cox., MRN:  [id number], DOB:  1932/04/02, LOS: 7 ADMISSION DATE:  01/12/2019, CONSULTATION DATE:  01/27/2019 REFERRING MD:  Horris Latino, MD CHIEF COMPLAINT:  Shortness of breath, hemoptysis  Brief History   Tanner Cox is a 85 year old male with pulmonary fibrosis on CellCept and steroids who presents with chills, shortness of breath and increased home oxygen requirement.  PCCM consulted for small volume hemoptysis.  Past Medical History  COPD, AAA, CAD, Psoriatec arthritis, GERD, nephrolithiasis status post recent stent  Significant Hospital Events   7/10 admitted to [facility] 7/11 Near cardiac arrest, SVT, altered mental status, Seen by [facility] for recommendation on ILD flare, steroids started 7/12 Mild delirium first noted by family 7/13-7/14: Delirium persist 7/15 Overnight increasing O2 requirements, agitation, hypertensive crisis, CTH negative, new hematuria, labile BP 7/16 Bradycardia, precedex d/c'd. FIO2/PEEP decreased. CXR a little worse but may be volume loss from low tidal vol ventilation 7/17.  Starting spontaneous breathing trials.  Still working on optimal sedation regimen.  Excellent tidal volumes on SBT.  Assessing for extubation.  Stopping vancomycin. Consults:  PCCM  Procedures:    Significant Diagnostic Tests:  CTA C/A/P 7/8: Emphysematous changes with peripheral reticular opacities CT Head 7/15: Negative  Micro Data:  COVID-19 7/10 - neg MRSA by PCR 7/11 - neg BCX 7/10 >> UCx 7/10 - neg Sputum 7/15 >>  Antimicrobials:  Cefepime 7/10>> Doxycycline 7/14 >> 7/15 Zosyn 7/15 >> Vanc 7/15 >> 7/17 Interim history/subjective:  No issues.  Overnight    Objective   Blood pressure (Abnormal) 119/49, pulse (Abnormal) 44, temperature 98.4 F (36.9 C), temperature source Axillary, resp. rate (Abnormal) 0, height 6' (1.829 m), weight 102.5 kg, SpO2 95 %. CVP:  [3 mmHg-8 mmHg] 3 mmHg  Vent Mode: PSV FiO2 (%):  [45 %-50 %] 45 % Set Rate:  [28 bmp]  28 bmp Vt Set:  [460 mL] 460 mL PEEP:  [5 cmH20] 5 cmH20 Pressure Support:  [5 cmH20] 5 cmH20 Plateau Pressure:  [14 cmH20-16 cmH20] 16 cmH20   Intake/Output Summary (Last 24 hours) at 01/15/2019 0915 Last data filed at 01/15/2019 0856 Gross per 24 hour  Intake 2765.76 ml  Output 1600 ml  Net 1165.76 ml   Filed Weights   01/04/2019 2127 01/11/19 0500 01/15/19 0500  Weight: 106.1 kg 102.9 kg 102.5 kg    Physical Exam: General this is an obese 85 year old white male he is currently awake and more interactive than he was in comparison to 7/16 HEENT: Normocephalic atraumatic orally intubated no JVD mucous membranes are moist pupils are reactive but pinpoint Pulmonary: Crackles in bases, excellent tidal volume on pressure support of 5 Cardiac: Irregular irregular with atrial fibrillation and runs of rapid ventricular response Abdomen: Soft, not tender, no organomegaly, positive bowel sounds.  Tolerating tube feeds. Extremities: Warm dry brisk capillary refill no significant edema no ecchymosis strong pulses Neuro: Opens eyes, interactive, follows commands intermittently.   Resolved Hospital Problem list   Hemoptysis, Hypertensive Crisis  Assessment & Plan:   Acute on chronic hypoxemic respiratory failure, ILD exacerbation with small volume hemopytsis/ Now with progressive respiratory failure - aspiration event vs progression of ILD vs cardiogenic pulm edema: Portable chest x-ray personally reviewed: This demonstrates stable bibasilar airspace disease.  There may be an element of atelectasis versus infiltrate superimposed on underlying chronic changes support apparatus is in satisfactory position Tolerating spontaneous breathing trial with excellent ventilator mechanics on PEEP of 5 and pressure support of 5  this Plan: Continue spontaneous breathing  trial  Keep euvolemic  Holding Solu-Medrol at 20 mg daily  Follow-up bronchial alveolar lavage results  Day #3 antibiotics, as he has  not grown MRSA will discontinue vancomycin but continue Zosyn  Holding CellCept given concern about of acute infection  Weaning sedation  Assess for extubation assuming delirium and blood pressure can be better controlled  VAP bundle  Paroxysmal Afib with intermittent rapid ventricular response and then also intermittent bradycardia Plan: Continue PRN Lopressor  Continue telemetry monitoring  Holding anticoagulation given recent hemoptysis and upper airway obstruction due to this  Avoid volume depletion   Leukocytosis (on methylprednisolone):  Plan: Trend CBC  Labile blood pressure with hypertension and then drug-induced hypotension.   -He is exquisitely sensitive to sedation Plan: PRN labetalol for hypertension Also keeping phenylephrine on board in case becomes more hypotensive  L kidney stone s/p stenting, R atrophied kidney CKD, stage III: -He has had marginal bump in serum creatinine but seems to be holding 1.7, this is bumped up to 1.83, central venous pressure at 3 suggesting he is on the dry side Plan: No further diuresis for now  Discontinuing vancomycin  Keeping euvolemic  Continue free water replacement  Fluid and electrolyte imbalance: Hypernatremia, hyperchloremia Plan Free water replacement A.m. chemistry  Delirium/ ETOH withdrawal/ possibly exacerbated by steroids/ consider infectious process or hypoxia: Plan: Changing to Precedex discontinuing propofol  Frequent reorientation  At bedtime melatonin  RASS goal is 0  Continue vitamin B and thiamine   Best practice:  Diet: NPO Pain/Anxiety/Delirium protocol (if indicated): 7/15 initiated  VAP protocol (if indicated): 7/15 initiated   DVT prophylaxis: SCDs GI prophylaxis: PPI Glucose control: CBG q4h Mobility: Bed rest Code Status: DNR Family Communication: Updated 7/16   Disposition: ICU  He remains critically ill however I am happy to report he is weaning currently.  I think his biggest obstacle  at this point will be his delirium and labile blood pressure.  We have resumed Precedex, will except some bradycardia if we can keep his delirium under control even if this means low-dose phenylephrine to support him.  Assessing for extubation later this a.m.  Stopping vancomycin.  I have updated his wife at bedside as well his his daughter Tanner Cox via phone  My critical care x38 minutes  Simonne MartinetPeter E Evelyn Moch ACNP-BC [facility] Pager # [phone] OR # [phone] if no answer

## 2019-01-16 LAB — CULTURE, RESPIRATORY W GRAM STAIN: Culture: NORMAL

## 2019-01-16 LAB — GLUCOSE, CAPILLARY
Glucose-Capillary: 97 mg/dL (ref 70–99)
Glucose-Capillary: 99 mg/dL (ref 70–99)
Glucose-Capillary: 115 mg/dL — ABNORMAL HIGH (ref 70–99)
Glucose-Capillary: 88 mg/dL (ref 70–99)
Glucose-Capillary: 95 mg/dL (ref 70–99)
Glucose-Capillary: 135 mg/dL — ABNORMAL HIGH (ref 70–99)
Glucose-Capillary: 121 mg/dL — ABNORMAL HIGH (ref 70–99)
Glucose-Capillary: 99 mg/dL (ref 70–99)

## 2019-01-16 MED ORDER — HYDRALAZINE HCL 20 MG/ML IJ SOLN
10.0000 mg | INTRAMUSCULAR | Status: DC | PRN
  Administered 2019-01-16 – 2019-01-24 (×5): 10 mg via INTRAVENOUS
  Filled 2019-01-16 (×7): qty 1

## 2019-01-16 NOTE — Progress Notes (Signed)
[place] Progress Note Patient Name: Tanner Cox. DOB: [birth year] MRN: [id number]   Date of Service  01/16/2019  HPI/Events of Note  Hypertension - BP = 176/59.  eICU Interventions  Will order: 1. Hydralazine 10 mg IV Q 4 hours PRN SBP  170 or DBP > 100.     Intervention Category Major Interventions: Hypertension - evaluation and management  Danaly Bari Eugene 01/16/2019, 9:09 PM

## 2019-01-16 NOTE — Progress Notes (Signed)
Pharmacy Antibiotic Note  Tanner Cox. is a 85 y.o. male admitted on 01/04/2019 with severe abd pain.  Pharmacy has been consulted for zosyn dosing for aspiration PNA.   01/16/2019  D#4 zosyn AF WBC WNL 7/17 CXR: Stable bibasilar opacities are noted which may represent scarring or fibrosis, but superimposed acute edema or inflammation cannot be excluded Extubated 7/17, off pressors.    Plan: Zosyn 3.375g IV q8h (4 hour infusion).  F/u renal function, WBC, temp, culture data   Height: 6' (182.9 cm) Weight: 229 lb 0.9 oz (103.9 kg) IBW/kg (Calculated) : 77.6  Temp (24hrs), Avg:98.1 F (36.7 C), Min:97.6 F (36.4 C), Max:98.5 F (36.9 C)  Recent Labs  Lab 01/11/19 0219 01/12/19 0221 01/13/19 0227 01/14/19 0403 01/15/19 0517 01/15/19 0541  WBC 13.2* 12.7* 10.9* 10.7* 10.1  --   CREATININE 1.75* 1.76* 1.72* 1.76*  --  1.84*    Estimated Creatinine Clearance: 36.6 mL/min (A) (by C-G formula based on SCr of 1.84 mg/dL (H)).    Allergies  Allergen Reactions  . Oxycodone-Acetaminophen Nausea And Vomiting  . Sulfa Antibiotics Other (See Comments)    Per wife, pt has a hypersensitivity  . Codeine Rash  . Escitalopram Hives and Rash  . Sulfamethoxazole-Trimethoprim Nausea Only  Antimicrobials this admission: 7/10 cefepime >> 7/11 7/8 ancef x 1 7/11 doxy>> missed all doses 7/12 & 7/13>> 7/15 7/11 azith x 1 7/15 Vanc>>7/17 7/15 zosyn >> Dose adjustments this admission:  Microbiology results: 7/8 MRSA PCR neg 7/11 MRSA PCR neg 7/11 UCx NGF 7/10 BCx2: ngF 7/15 resp cx BAL: rare GP cocci 7/15 AFB: neg 7/15 fungus cx: sent  Thank you for allowing pharmacy to be a part of this patient's care.  Eudelia Bunch, Pharm.D [phone] 01/16/2019 9:43 AM

## 2019-01-16 NOTE — Progress Notes (Signed)
SLP Note  Patient Details Name: Tanner Cox. MRN: [id number] DOB: May 20, 1932  Swallow evaluation ordered,  Will see pt for evaluation as soon as schedule permits.   Thanks.   Luanna Salk, MS [facility] SLP Acute Rehab Services Pager [phone] Office [phone]           Macario Golds 01/16/2019, 4:54 PM

## 2019-01-16 NOTE — Progress Notes (Signed)
NAME:  Tanner Cox., MRN:  [id number], DOB:  1931/07/24, LOS: 8 ADMISSION DATE:  01/26/2019, CONSULTATION DATE:  01/03/2019 REFERRING MD:  Horris Latino, MD CHIEF COMPLAINT:  Shortness of breath, hemoptysis  Brief History   Mr. Hanrahan Junior is a 85 year old male with pulmonary fibrosis on CellCept and steroids who presents with chills, shortness of breath and increased home oxygen requirement.  PCCM consulted for small volume hemoptysis.  Past Medical History  COPD, AAA, CAD, Psoriatec arthritis, GERD, nephrolithiasis status post recent stent  Significant Hospital Events   7/10 admitted to [facility] 7/11 Near cardiac arrest, SVT, altered mental status, Seen by [facility] for recommendation on ILD flare, steroids started 7/12 Mild delirium first noted by family 7/13-7/14: Delirium persist 7/15 Overnight increasing O2 requirements, agitation, hypertensive crisis, CTH negative, new hematuria, labile BP 7/16 Bradycardia, precedex d/c'd. FIO2/PEEP decreased. CXR a little worse but may be volume loss from low tidal vol ventilation 7/17.  Starting spontaneous breathing trials.  Still working on optimal sedation regimen.  Excellent tidal volumes on SBT.  Assessing for extubation.  Stopping vancomycin. Consults:  PCCM  Procedures:    Significant Diagnostic Tests:  CTA C/A/P 7/8: Emphysematous changes with peripheral reticular opacities CT Head 7/15: Negative  Micro Data:  COVID-19 7/10 - neg MRSA by PCR 7/11 - neg BCX 7/10 >> UCx 7/10 - neg Sputum 7/15 >>  Antimicrobials:  Cefepime 7/10>> Doxycycline 7/14 >> 7/15 Zosyn 7/15 >> Vanc 7/15 >> 7/17 Interim history/subjective:  Has remained on high flow nasal cannula with adequate saturations Still somewhat delirious but redirectable Precedex 0.4    Objective   Blood pressure (!) 119/49, pulse 81, temperature 99 F (37.2 C), temperature source Axillary, resp. rate (!) 21, height 6' (1.829 m), weight 103.9 kg, SpO2 (!) 88 %. CVP:  [3 mmHg-10  mmHg] 10 mmHg  FiO2 (%):  [60 %-70 %] 70 %   Intake/Output Summary (Last 24 hours) at 01/16/2019 1252 Last data filed at 01/16/2019 1200 Gross per 24 hour  Intake 311.19 ml  Output 1700 ml  Net -1388.81 ml   Filed Weights   01/11/19 0500 01/15/19 0500 01/16/19 0440  Weight: 102.9 kg 102.5 kg 103.9 kg    Physical Exam: General obese man, lying in bed, no distress, mild delirium but redirectable HEENT: Oropharynx clear, weak voice with some upper airway hoarseness Pulmonary: Bibasilar inspiratory crackles Cardiac: Irregularly irregular, distant, normal rate Abdomen: Soft, obese, nontender, positive bowel sounds Extremities: No significant edema Neuro: Eyes open, tries to interact but difficult to understand, will follow some commands intermittently.  Impulsive and clearly some evidence of confusion but he is redirectable   Resolved Hospital Problem list   Hemoptysis, Hypertensive Crisis  Assessment & Plan:   Acute on chronic hypoxemic respiratory failure, ILD exacerbation with small volume hemopytsis/ Now with progressive respiratory failure - aspiration event vs progression of ILD vs cardiogenic pulm edema:  Plan: Continue high flow nasal cannula and wean as able Push pulmonary hygiene -4.8 L for the hospitalization CellCept is on hold in the setting of possible acute infection Day #4 antibiotics, remains on Zosyn, planning for 7 days total.  BAL remains culture negative  Paroxysmal Afib with intermittent rapid ventricular response and then also intermittent bradycardia Plan: Telemetry PRN rate control Defer anticoagulation given his recent hemoptysis   Leukocytosis (on methylprednisolone):  Plan: Follow CBC  Labile blood pressure with hypertension and then drug-induced hypotension.   -He is exquisitely sensitive to sedation Plan: Off pressors, hopefully will be able to  avoid restarting Careful with any medications that would acutely drop BP  L kidney stone s/p  stenting, R atrophied kidney CKD, stage III: Plan: Continue to follow BMP, urine output.  Serum creatinine appears to be stabilizing, 1.84 today Hold off on diuresis 7/18 Free water is on hold since extubation, loss of gastric access Consider initiation low-dose D5W next 24 hours if we do not believe he will be able to take p.o.  Fluid and electrolyte imbalance: Hypernatremia, hyperchloremia Plan Free water replacement on hold currently as above Follow BMP  Delirium/ ETOH withdrawal/ possibly exacerbated by steroids/ consider infectious process or hypoxia: Plan: Continue Precedex at low dose, goal start to wean if he can tolerate 7/18 Frequent reorientation Melatonin as ordered Thiamine, folate  Hx B12 deficiency Plan B12 915 on 7/17, reassuring  Best practice:  Diet: NPO Pain/Anxiety/Delirium protocol (if indicated): 7/15 initiated  VAP protocol (if indicated): 7/15 initiated   DVT prophylaxis: SCDs GI prophylaxis: PPI Glucose control: CBG q4h Mobility: Bed rest Code Status: DNR Family Communication: Discussed with patient's wife at bedside 7/18 Disposition: ICU  Independent critical care time 32 minutes  Baltazar Apo, MD, PhD 01/16/2019, 1:01 PM [place] Pulmonary and Critical Care [phone] or if no answer [phone]

## 2019-01-17 ENCOUNTER — Inpatient Hospital Stay (HOSPITAL_COMMUNITY): Payer: Medicare Other

## 2019-01-17 DIAGNOSIS — G9340 Encephalopathy, unspecified: Secondary | ICD-10-CM

## 2019-01-17 LAB — BASIC METABOLIC PANEL
Anion gap: 14 (ref 5–15)
BUN: 48 mg/dL — ABNORMAL HIGH (ref 8–23)
CO2: 23 mmol/L (ref 22–32)
Calcium: 9 mg/dL (ref 8.9–10.3)
Chloride: 116 mmol/L — ABNORMAL HIGH (ref 98–111)
Creatinine, Ser: 1.69 mg/dL — ABNORMAL HIGH (ref 0.61–1.24)
GFR calc Af Amer: 42 mL/min — ABNORMAL LOW (ref >60)
GFR calc non Af Amer: 36 mL/min — ABNORMAL LOW (ref >60)
Glucose, Bld: 94 mg/dL (ref 70–99)
Potassium: 3.7 mmol/L (ref 3.5–5.1)
Sodium: 153 mmol/L — ABNORMAL HIGH (ref 135–145)

## 2019-01-17 LAB — GLUCOSE, CAPILLARY
Glucose-Capillary: 84 mg/dL (ref 70–99)
Glucose-Capillary: 88 mg/dL (ref 70–99)
Glucose-Capillary: 129 mg/dL — ABNORMAL HIGH (ref 70–99)
Glucose-Capillary: 148 mg/dL — ABNORMAL HIGH (ref 70–99)

## 2019-01-17 LAB — CBC
HCT: 32.7 % — ABNORMAL LOW (ref 39.0–52.0)
Hemoglobin: 9.7 g/dL — ABNORMAL LOW (ref 13.0–17.0)
MCH: 29.8 pg (ref 26.0–34.0)
MCHC: 29.7 g/dL — ABNORMAL LOW (ref 30.0–36.0)
MCV: 100.3 fL — ABNORMAL HIGH (ref 80.0–100.0)
Platelets: 179 10*3/uL (ref 150–400)
RBC: 3.26 MIL/uL — ABNORMAL LOW (ref 4.22–5.81)
RDW: 14.2 % (ref 11.5–15.5)
WBC: 12.6 10*3/uL — ABNORMAL HIGH (ref 4.0–10.5)
nRBC: 0 % (ref 0.0–0.2)

## 2019-01-17 LAB — MAGNESIUM: Magnesium: 2.2 mg/dL (ref 1.7–2.4)

## 2019-01-17 MED ORDER — METHYLPREDNISOLONE SODIUM SUCC 40 MG IJ SOLR
10.0000 mg | Freq: Every day | INTRAMUSCULAR | Status: DC
  Administered 2019-01-17 – 2019-01-18 (×2): 10 mg via INTRAVENOUS
  Filled 2019-01-17 (×2): qty 1

## 2019-01-17 MED ORDER — ORAL CARE MOUTH RINSE
15.0000 mL | Freq: Two times a day (BID) | OROMUCOSAL | Status: DC
  Administered 2019-01-17 – 2019-01-28 (×12): 15 mL via OROMUCOSAL

## 2019-01-17 MED ORDER — CHLORHEXIDINE GLUCONATE 0.12 % MT SOLN
15.0000 mL | Freq: Two times a day (BID) | OROMUCOSAL | Status: DC
  Administered 2019-01-17 – 2019-01-26 (×16): 15 mL via OROMUCOSAL
  Filled 2019-01-17 (×10): qty 15

## 2019-01-17 MED ORDER — LEVALBUTEROL HCL 1.25 MG/0.5ML IN NEBU
0.6300 mg | INHALATION_SOLUTION | RESPIRATORY_TRACT | Status: DC | PRN
  Filled 2019-01-17: qty 0.5

## 2019-01-17 MED ORDER — DEXTROSE 5 % IV SOLN
INTRAVENOUS | Status: DC
  Administered 2019-01-17 – 2019-01-18 (×3): via INTRAVENOUS

## 2019-01-17 MED ORDER — FUROSEMIDE 10 MG/ML IJ SOLN
40.0000 mg | Freq: Once | INTRAMUSCULAR | Status: AC
  Administered 2019-01-17: 04:00:00 40 mg via INTRAVENOUS
  Filled 2019-01-17: qty 4

## 2019-01-17 NOTE — Progress Notes (Signed)
SLP Cancellation Note  Patient Details Name: Tanner Cox. MRN: [id number] DOB: 1931/11/03   Cancelled treatment:       Reason Eval/Treat Not Completed: Fatigue/lethargy limiting ability to participate; pt on Bipap earlier in am; confused/agitated/lethargic; ST deferred; will re-attempt as pt mentation improves.   Elvina Sidle, M.S., CCC-SLP 01/17/2019, 3:22 PM

## 2019-01-17 NOTE — Progress Notes (Signed)
[place] Progress Note Patient Name: Tanner Cox. DOB: 04-14-1932 MRN: [id number]   Date of Service  01/17/2019  HPI/Events of Note  Hypoxia - Increased FiO2 requirement. CXR looks "wet" vs atelectasis. Creatinine = 1.84.   eICU Interventions  Will order: 1. Lasix 40 mg IV now. 2. Incentive spirometry Q 1 hour while awake.      Intervention Category Major Interventions: Hypoxemia - evaluation and management  Margurite Duffy Eugene 01/17/2019, 3:45 AM

## 2019-01-17 NOTE — Progress Notes (Signed)
Pt refused finger stick for CBG check. Got combative with sitter. Will attempt to check CBG later. Will continue to monitor patient.

## 2019-01-17 NOTE — Progress Notes (Signed)
[place] Progress Note Patient Name: Tanner Cox. DOB: 1931/10/21 MRN: [id number]   Date of Service  01/17/2019  HPI/Events of Note  Hypoxemia - Remains hypoxemic.   eICU Interventions  Will order: 1. BiPAP per RT. However, he is not the best candidate d/t agitation.      Intervention Category Major Interventions: Hypoxemia - evaluation and management  Sommer,Steven Eugene 01/17/2019, 5:04 AM

## 2019-01-17 NOTE — Progress Notes (Signed)
NAME:  Tanner Rudin., MRN:  [id number], DOB:  1931-11-29, LOS: 9 ADMISSION DATE:  01/29/2019, CONSULTATION DATE:  01/19/2019 REFERRING MD:  Horris Latino, MD CHIEF COMPLAINT:  Shortness of breath, hemoptysis  Brief History   Mr. Tanner Cox is a 85 year old male with pulmonary fibrosis on CellCept and steroids who presents with chills, shortness of breath and increased home oxygen requirement.  PCCM consulted for small volume hemoptysis.  Past Medical History  COPD, AAA, CAD, Psoriatec arthritis, GERD, nephrolithiasis status post recent stent  Significant Hospital Events   7/10 admitted to [facility] 7/11 Near cardiac arrest, SVT, altered mental status, Seen by [facility] for recommendation on ILD flare, steroids started 7/12 Mild delirium first noted by family 7/13-7/14: Delirium persist 7/15 Overnight increasing O2 requirements, agitation, hypertensive crisis, CTH negative, new hematuria, labile BP 7/16 Bradycardia, precedex d/c'd. FIO2/PEEP decreased. CXR a little worse but may be volume loss from low tidal vol ventilation 7/17.  Starting spontaneous breathing trials.  Still working on optimal sedation regimen.  Excellent tidal volumes on SBT.  Assessing for extubation.  Stopping vancomycin. Consults:  PCCM  Procedures:    Significant Diagnostic Tests:  CTA C/A/P 7/8: Emphysematous changes with peripheral reticular opacities CT Head 7/15: Negative  Micro Data:  COVID-19 7/10 - neg MRSA by PCR 7/11 - neg BCX 7/10 >> negative UCx 7/10 -negative BAL 7/15 >> negative  Antimicrobials:  Cefepime 7/10>> Doxycycline 7/14 >> 7/15 Zosyn 7/15 >> Vanc 7/15 >> 7/17 Interim history/subjective:  Intermittent agitation.  Remains on Precedex 0.4 He had desaturation early morning, was placed on BiPAP and received Lasix x1, -7.4 L for the hospitalization.  Now off BiPAP and back on high flow nasal cannula Sodium rising    Objective   Blood pressure (!) 160/75, pulse (!) 30, temperature 97.6 F  (36.4 C), temperature source Oral, resp. rate (!) 22, height 6' (1.829 m), weight 101.9 kg, SpO2 93 %.    FiO2 (%):  [60 %-100 %] 80 %   Intake/Output Summary (Last 24 hours) at 01/17/2019 0854 Last data filed at 01/17/2019 0821 Gross per 24 hour  Intake 405.4 ml  Output 2950 ml  Net -2544.6 ml   Filed Weights   01/15/19 0500 01/16/19 0440 01/17/19 0433  Weight: 102.5 kg 103.9 kg 101.9 kg    Physical Exam: General obese man, lying in bed in no distress.  He has had delirium through the night but currently he is calm. HEENT: Oropharynx clear, very weak voice, unintelligible but tries to speak Pulmonary: Bilateral inspiratory crackles Cardiac: Irregularly irregular, 70s, distant Abdomen: Soft, obese, nontender, positive bowel sounds Extremities: No edema Neuro: Opens eyes to voice, tries to phonate but cannot be understood, globally weak, does follow commands   Resolved Hospital Problem list   Hemoptysis, Hypertensive Crisis  Assessment & Plan:   Acute on chronic hypoxemic respiratory failure, ILD exacerbation with small volume hemopytsis/ Now with progressive respiratory failure - aspiration event vs progression of ILD vs cardiogenic pulm edema:  Plan: Temporary required BiPAP this morning, would like to avoid if possible.  Continue high flow nasal cannula and wean as able Push pulmonary hygiene aggressively Additional dose Lasix given 7/19 a.m., currently -7.4 L total.  I suspect that ILD, inflammation/infection of the driving forces here not volume status CellCept is on hold Day #5 antibiotics, remains on Zosyn, planning for 7 days total.  BAL cultures negative  Paroxysmal Afib with intermittent rapid ventricular response and then also intermittent bradycardia Plan: Adequate rate control currently Anticoagulation  deferred given his hemoptysis earlier in the course PRN rate control  Leukocytosis (on methylprednisolone):  Plan: Follow CBC Empiric treatment for possible  respiratory infection, culture negative as above  Labile blood pressure with hypertension and then drug-induced hypotension.   -He is exquisitely sensitive to sedation Plan: Stable off pressors Careful any medications that would acutely affect BP  L kidney stone s/p stenting, R atrophied kidney CKD, stage III, stable serum creatinine: Plan: Serum creatinine has stabilized, 1.69 on 7/19 Determining whether to give diuresis on a daily basis, he did receive Lasix a.m. 7/19  Fluid and electrolyte imbalance: Hypernatremia, hyperchloremia Plan Free water was held after extubation, unable to take p.o.  He needs reinitiation but unclear that he would tolerate or allow an NG tube.  May need to try for placement in the coming days.  In the meantime I will start him on D5W. Current free water deficit ~5.6L Follow BMP  Delirium/ ETOH withdrawal/ possibly exacerbated by steroids/ consider infectious process or hypoxia: Plan: Try to start weaning Precedex 7/19 Frequent reorientation.  Will speak with his spouse to ensure that she allows him adequate time to rest, be calm Melatonin as ordered Thiamine, folate for another day, d/c 7/20  Hx B12 deficiency Plan B12 >> 915 on 7/17, reassuring  Best practice:  Diet: NPO Pain/Anxiety/Delirium protocol (if indicated): remains on low dose precedex VAP protocol (if indicated): complete DVT prophylaxis: SCDs GI prophylaxis: PPI Glucose control: CBG q4h Mobility: Bed rest Code Status: DNR Family Communication: Discussed with patient's wife at bedside 7/19 Disposition: ICU  Independent critical care time 34 minutes  Baltazar Apo, MD, PhD 01/17/2019, 8:54 AM  Pulmonary and Critical Care [phone] or if no answer [phone]

## 2019-01-17 NOTE — Progress Notes (Signed)
[place] Progress Note Patient Name: Tanner Cox. DOB: 02-03-1932 MRN: [id number]   Date of Service  01/17/2019  HPI/Events of Note  Hypoxia - Increasing O2 requirement 25% --> 60% HFNC.   eICU Interventions  Will order: 1. Portable CXR STAT.     Intervention Category Major Interventions: Hypoxemia - evaluation and management  Sofia Jaquith Eugene 01/17/2019, 2:18 AM

## 2019-01-18 ENCOUNTER — Inpatient Hospital Stay (HOSPITAL_COMMUNITY): Payer: Medicare Other

## 2019-01-18 LAB — BASIC METABOLIC PANEL
Anion gap: 12 (ref 5–15)
BUN: 42 mg/dL — ABNORMAL HIGH (ref 8–23)
CO2: 27 mmol/L (ref 22–32)
Calcium: 8.4 mg/dL — ABNORMAL LOW (ref 8.9–10.3)
Chloride: 108 mmol/L (ref 98–111)
Creatinine, Ser: 1.73 mg/dL — ABNORMAL HIGH (ref 0.61–1.24)
GFR calc Af Amer: 41 mL/min — ABNORMAL LOW (ref >60)
GFR calc non Af Amer: 35 mL/min — ABNORMAL LOW (ref >60)
Glucose, Bld: 148 mg/dL — ABNORMAL HIGH (ref 70–99)
Potassium: 3.4 mmol/L — ABNORMAL LOW (ref 3.5–5.1)
Sodium: 147 mmol/L — ABNORMAL HIGH (ref 135–145)

## 2019-01-18 LAB — CBC
HCT: 31.3 % — ABNORMAL LOW (ref 39.0–52.0)
Hemoglobin: 9.5 g/dL — ABNORMAL LOW (ref 13.0–17.0)
MCH: 30.4 pg (ref 26.0–34.0)
MCHC: 30.4 g/dL (ref 30.0–36.0)
MCV: 100.3 fL — ABNORMAL HIGH (ref 80.0–100.0)
Platelets: 169 10*3/uL (ref 150–400)
RBC: 3.12 MIL/uL — ABNORMAL LOW (ref 4.22–5.81)
RDW: 14.1 % (ref 11.5–15.5)
WBC: 9.9 10*3/uL (ref 4.0–10.5)
nRBC: 0 % (ref 0.0–0.2)

## 2019-01-18 LAB — MAGNESIUM: Magnesium: 2.4 mg/dL (ref 1.7–2.4)

## 2019-01-18 LAB — GLUCOSE, CAPILLARY: Glucose-Capillary: 113 mg/dL — ABNORMAL HIGH (ref 70–99)

## 2019-01-18 MED ORDER — LOPERAMIDE HCL 2 MG PO CAPS: 2.0000 mg | ORAL_CAPSULE | ORAL | Status: DC | PRN

## 2019-01-18 MED ORDER — CHLORDIAZEPOXIDE HCL 5 MG PO CAPS
5.0000 mg | ORAL_CAPSULE | Freq: Four times a day (QID) | ORAL | Status: DC
  Administered 2019-01-18 (×3): 5 mg via ORAL
  Filled 2019-01-18 (×3): qty 1

## 2019-01-18 MED ORDER — SODIUM CHLORIDE 0.9% FLUSH: 10.0000 mL | INTRAVENOUS | Status: DC | PRN

## 2019-01-18 MED ORDER — POTASSIUM CHLORIDE 10 MEQ/50ML IV SOLN: 10.0000 meq | INTRAVENOUS | Status: DC

## 2019-01-18 MED ORDER — HALOPERIDOL LACTATE 5 MG/ML IJ SOLN
2.5000 mg | INTRAMUSCULAR | Status: AC
  Administered 2019-01-18: 2.5 mg via INTRAVENOUS
  Filled 2019-01-18: qty 1

## 2019-01-18 MED ORDER — HALOPERIDOL LACTATE 5 MG/ML IJ SOLN
2.5000 mg | Freq: Four times a day (QID) | INTRAMUSCULAR | Status: DC | PRN
  Administered 2019-01-18 – 2019-01-19 (×2): 2.5 mg via INTRAVENOUS
  Filled 2019-01-18 (×2): qty 1

## 2019-01-18 MED ORDER — POTASSIUM CHLORIDE CRYS ER 20 MEQ PO TBCR
40.0000 meq | EXTENDED_RELEASE_TABLET | Freq: Once | ORAL | Status: AC
  Administered 2019-01-18: 40 meq via ORAL
  Filled 2019-01-18: qty 2

## 2019-01-18 MED ORDER — SODIUM CHLORIDE 0.9% FLUSH
10.0000 mL | Freq: Two times a day (BID) | INTRAVENOUS | Status: DC
  Administered 2019-01-18: 21:00:00 40 mL
  Administered 2019-01-18 – 2019-01-20 (×3): 10 mL

## 2019-01-18 MED ORDER — ADULT MULTIVITAMIN W/MINERALS CH
1.0000 | ORAL_TABLET | Freq: Every day | ORAL | Status: DC
  Administered 2019-01-18 – 2019-01-19 (×2): 1 via ORAL
  Filled 2019-01-18 (×2): qty 1

## 2019-01-18 NOTE — Progress Notes (Addendum)
NAME:  Tanner Swallows., MRN:  [id number], DOB:  [birth year], LOS: 15 ADMISSION DATE:  01/09/2019, CONSULTATION DATE:  01/15/2019 REFERRING MD:  Horris Latino, MD CHIEF COMPLAINT:  Shortness of breath, hemoptysis  Brief History   Tanner Cox is a 85 year old male with pulmonary fibrosis on CellCept and steroids who presents with chills, shortness of breath and increased home oxygen requirement.  PCCM consulted for small volume hemoptysis.  Past Medical History  COPD, AAA, CAD, Psoriatec arthritis, GERD, nephrolithiasis status post recent stent  Significant Hospital Events   7/10 admitted to [facility] 7/11 Near cardiac arrest, SVT, altered mental status, Seen by [facility] for recommendation on ILD flare, steroids started 7/12 Mild delirium first noted by family 7/13-7/14: Delirium persist 7/15 Overnight increasing O2 requirements, agitation, hypertensive crisis, CTH negative, new hematuria, labile BP 7/16 Bradycardia, precedex d/c'd. FIO2/PEEP decreased. CXR a little worse but may be volume loss from low tidal vol ventilation 7/17: Extubation.  Stopping vancomycin. 7/17-7/20. Agitation remains the primary barrier. Speech better as of 7/20 but still on high flow. On 7/20 transitioning to Haldol over precedex.   Consults:  PCCM  Procedures:    Significant Diagnostic Tests:  CTA C/A/P 7/8: Emphysematous changes with peripheral reticular opacities CT Head 7/15: Negative  Micro Data:  COVID-19 7/10 - neg MRSA by PCR 7/11 - neg BCX 7/10 >> negative UCx 7/10 -negative BAL 7/15 >> negative  Antimicrobials:   Doxycycline 7/14 >> 7/15 Vanc 7/15 >> 7/17 Cefepime 7/10>> 7/17  Zosyn 7/15 >>  Interim history/subjective:  Still intermittently agitated and at times combative    Objective   Blood pressure (Abnormal) 160/75, pulse 68, temperature 98.1 F (36.7 C), resp. rate 15, height 6' (1.829 m), weight 100.9 kg, SpO2 92 %. CVP:  [8 mmHg] 8 mmHg  FiO2 (%):  [35 %-70 %] 35 %    Intake/Output Summary (Last 24 hours) at 01/18/2019 0826 Last data filed at 01/18/2019 0810 Gross per 24 hour  Intake 2627.62 ml  Output 1750 ml  Net 877.62 ml   Filed Weights   01/16/19 0440 01/17/19 0433 01/18/19 0500  Weight: 103.9 kg 101.9 kg 100.9 kg    Physical Exam: General: 85 year old white male. Agitated, restless and combative  HEENT: NCAT no JVD. Sp/voice quality hoarse, rhonchus at times. No JVD. MMM Pulmonary: decreased t/o rhonchus cough at times Cardiac: regular irreg  Abdomen:soft not tender + bowel sounds Extremities: warm and dry  Neuro:agitated and confused. No focal motor def appreciated    Resolved Hospital Problem list   Hemoptysis, Hypertensive Crisis  Assessment & Plan:   Acute on chronic hypoxemic respiratory failure, ILD exacerbation with small volume hemopytsis/ Now with progressive respiratory failure - aspiration event vs progression of ILD vs cardiogenic pulm edema: 7/20 PCXR personally reviewed w/ persistent bibasilar L>R airspace disease. No sig change when accounting for penetration  Plan: Continue high flow nasal cannula and wean as tolerated Avoid biPAP d/t large dried clot found during intubation Pulmonary hygiene aggressively Cellcept on hold, continue steroids Continue Zosyn (day 6 out of 7) Get him OOB  Paroxysmal Afib with intermittent rapid ventricular response and then also intermittent bradycardia Plan: Anticoagulation deferred until hemoptysis improves PRN rate control  Leukocytosis (on methylprednisolone):  Plan: Trend CBC Empiric treatment for possible respiratory infection  Labile blood pressure with hypertension and then drug-induced hypotension.   Extremely sensitive to sedation Plan: Stable off pressors Careful any medications that would acutely affect BP Removing a-line  L kidney stone s/p stenting,  R atrophied kidney CKD, stage III, stable serum creatinine: CVP 2 Plan: Determine diuresis on daily basis,  based on CXR Q4h CVP monitoring for fluid trends  Fluid and electrolyte imbalance: Hypernatremia, hyperchloremia, hypokalemia  7/20 Na 147, free water deficit: ~2.5L Plan Replace K  Trend BMP Decrease D5W to 6m/hr  Delirium/ ETOH withdrawal/ possibly exacerbated by steroids/ consider infectious process or hypoxia: Plan: Stop precedex. Try haldol Frequent re-orientation interventions  Melatonin to promote sleep hygiene  Hx B12 deficiency Plan  Supplement thiamine  Protein Calorie Malnutrition Plan:  May need to place feeding tube BUT not sure that he can safely tolerate this.    Best practice:  Diet: NPO Pain/Anxiety/Delirium protocol (if indicated): N/A VAP protocol (if indicated): N/A  DVT prophylaxis: SCDs GI prophylaxis: PPI Glucose control: CBG q4h Mobility: PT/OT Code Status: PARTIAL - no CPR, defibrillation, or ACLS medications. Okay to intubated Family Communication: 7/20 Family discussion Disposition: ICU  Patient continues to be delirious. Per wife, delirium is improving. HR is well controlled AFIB. PRN metoprolol available for rate control. Increasing O2 requirements, now requiring HFNC 70% 35L. CXR shows stable L>R bibasilar airspace disease. Na level improving with D5W drip. Family discussion today regarding current and future plans for patient.   Critical Care time: 35 min  PErick ColaceACNP-BC LGirardPager # [phone] # [phone] no answer

## 2019-01-18 NOTE — Evaluation (Addendum)
Clinical/Bedside Swallow Evaluation Patient Details  Name: Tanner Cox. MRN: [id number] Date of Birth: 21-Apr-1932  Today's Date: 01/18/2019 Time: SLP Start Time (ACUTE ONLY): 3220 SLP Stop Time (ACUTE ONLY): 1605 SLP Time Calculation (min) (ACUTE ONLY): 35 min  Past Medical History:  Past Medical History:  Diagnosis Date  . Allergic rhinitis   . Anemia   . B12 deficiency   . BPH (benign prostatic hyperplasia)   . COPD (chronic obstructive pulmonary disease) ([place])   . GERD (gastroesophageal reflux disease)   . H/O laryngeal cancer   . History of kidney stones   . Hx of sleep apnea    not on CPAP  . Hypercholesterolemia   . Hypertension   . PAD (peripheral artery disease) ([place])   . Psoriatic arthritis ([place])   . Pulmonary fibrosis ([place])   . Vitamin D deficiency    Past Surgical History:  Past Surgical History:  Procedure Laterality Date  . APPENDECTOMY    . Bilateral bicep tendon repair    . CYSTOSCOPY W/ URETERAL STENT PLACEMENT Left 01/06/2019   Procedure: CYSTOSCOPY WITH LEFT  RETROGRADE PYELOGRAM/URETERAL STENT PLACEMENT;  Surgeon: Alexis Frock, MD;  Location: [facility]S;  Service: Urology;  Laterality: Left;  . Extracopreal Shockwave Therapy for kidney stones    . HEMORRHOID SURGERY    . NECK SURGERY     HPI:  pt is an 85 yo male adm to [facility] with AMS, required intubation from 7/15-7/17/2020.  He has needed HFNC warmed and on/off Bipap since extubation.  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 Swallow evaluation ordered.  His code status is partial - Bipap/intubation only no CPR.  Swallow evaluation ordered.  Pt has been confused and agitated during hospital stay.   Assessment / Plan / Recommendation Clinical Impression  Pt needing warmed HFNC at this time, oxygen at 40 Liters - 70% that may impact swallow/respiration reciprocity.  Wet congested cough and voice noted at baseline with dysphonia.   Oral care provided removing minimal amounts of viscous clear secretions.  After oral care, SlP provided pt with single ice chips - clinically with concern for spillage into airway pre=swallow as pt with immediate cough post-swallow with expectoration of copious bloody secretions  - RN reports that wife states pt would expectorate bloody secretions prior to admit as he has been on chronic steroids.  RN provided pt with his librium with applesauce - no indication of pharyngeal retention however with h/o laryngeal cancer s/p XRT 12-15 years ago, risk of sensorimotor deficits/fibrosis is high.  Submental region appears with decreased elasticity likely due to radiation.  Pt admits to this SLP h/o coughing with intake - likely some chronic dysphagia and potential aspiration given his hx and with current advanced age, ILD, respiratory difficulties - functional swallow prognosis without high aspiration risk is guarded.  Informed RN and requested she only give pt medication he absolutely needs.  MBS planned for 01/19/2019 with continued medical/respiratory improvement. SLP Visit Diagnosis: Dysphagia, oropharyngeal phase (R13.12)    Aspiration Risk  Severe aspiration risk;Risk for inadequate nutrition/hydration    Diet Recommendation NPO;Other (Comment)(necessary medicine with small amount of applesauce)   Medication Administration: Crushed with puree    Other  Recommendations Oral Care Recommendations: Oral care QID   Follow up Recommendations Skilled Nursing facility      Frequency and Duration   tbd         Prognosis  Prognosis for Safe Diet Advancement: Guarded Barriers to Reach Goals: Time post onset;Severity of deficits;Behavior      Swallow Study   General Date of Onset: 2018/08/18 HPI: pt is an 85 yo male [facility], required intubation from 7/15-7/17/2020.  He has needed HFNC warmed and on/off Bipap since extubation.  Pt has ILD,  laryngeal cancer hx with XRT 12-15 years ago and he  self reports Polio when he was 26 that recurred. Swallow evaluation ordered.  His code status is partial - Bipap/intubation only no CPR.  Swallow evaluation ordered.  Pt has been confused and agitated during hospital stay. Type of Study: Bedside Swallow Evaluation Previous Swallow Assessment: none in epic - Diet Prior to this Study: NPO Temperature Spikes Noted: No Respiratory Status: (HFNC) Behavior/Cognition: Alert;Impulsive;Confused(pt gets agitated easily, inconsistently follows directions) Oral Cavity - Dentition: (some dentition present) Self-Feeding Abilities: Total assist Patient Positioning: Upright in bed Baseline Vocal Quality: Low vocal intensity Volitional Cough: Congested;Wet(at times cough is productive) Volitional Swallow: (pt able to swallow ice chip when melted)    Oral/Motor/Sensory Function Overall Oral Motor/Sensory Function: Generalized oral weakness   Ice Chips Ice chips: Impaired Presentation: Spoon Pharyngeal Phase Impairments: Cough - Immediate;Suspected delayed Swallow Other Comments: clinically with delayed pharyngeal swallow likely and immediate cough with expectoration of copious bloody secretions  - RN reports that wife states pt would expectorate bloody secretions prior to admit as he has been on chronic steroids   Thin Liquid Thin Liquid: Not tested    Nectar Thick Nectar Thick Liquid: Not tested   Honey Thick Honey Thick Liquid: Not tested   Puree Presentation: Spoon Other Comments: no indication of pharyngeal retention however with h/o laryngeal cancer s/p XRT 12-15 years ago and premorbid dysphagia, risk of sensorimotor deficits/fibrosis is high   Solid     Solid: Not tested      Tanner Cox, Tanner Cox 01/18/2019,4:43 PM   Donavan Burnetamara Marshall Kampf, MS [facility] SLP Acute Rehab Services Pager [phone] Office [phone]

## 2019-01-18 NOTE — Progress Notes (Signed)
Nutrition Follow-up  DOCUMENTATION CODES:   Obesity unspecified  INTERVENTION:  - diet advancement as medically feasible.   NUTRITION DIAGNOSIS:   Inadequate oral intake related to inability to eat as evidenced by NPO status. -ongoing  GOAL:   Patient will meet greater than or equal to 90% of their needs -unable to meet at this time.   MONITOR:   Diet advancement, Labs, Weight trends  ASSESSMENT:   85 year old male with multiple comorbidities including history of HTN, COPD with pulmonary fibrosis, chronic hypoxic failure on 3-4 L of O2 at home, AAA, CAD, nephrolithiasis, recent left ureteric stent placement and planned staged procedure by urology, psoriatic arthritis, GERD. He presented to the ED with complaints of worsening SOB, productive cough, and one episode of blood-tinged sputum. He was found to have exacerbation of underlying interstitial lung disease. COVID-19 negative.  Significant Events: 7/10- admission 7/15- intubation, OGT placement; bronchoscopy 7/16- RD assessment for TF 7/17- extubation, OGT removal   Patient was extubated on 7/17. Estimated nutrition needs updated at this time and based on current weight of 100.9 kg, which is -5.2 kg compared to admission weight. SLP attempted to work with patient yesterday for swallow evaluation but unable d/t patient being on BiPAP at that time. Patient is currently on HFNC.   Per notes: - afib with intermittent RVR and intermittent bradycardia - leukocytosis - L kidney stone s/p stenting, atrophied R kidney - fluid and electrolyte imbalance - history of vitamin B12 deficiency--checked 7/17 and was the high end of WDL    Labs reviewed; CBG: 113 mg/dl today, Na: 147 mmol/l, K: 3.4 mmol/l, BUN: 42 mg/dl, creatinine: 1.73 mg/dl, Ca: 8.4 mg/dl, GFR: 35 ml/min.  Medications reviewed; 10 mg solu-medrol/day, 1 packet miralax/day, 100 mg IV thiamine/day. IVF; D5 @ 100 ml/hr (408 kcal).    Diet Order:   Diet Order       Diet NPO time specified  Diet effective now              EDUCATION NEEDS:   No education needs have been identified at this time  Skin:  Skin Assessment: Reviewed RN Assessment  Last BM:  7/17  Height:   Ht Readings from Last 1 Encounters:  01/13/19 6' (1.829 m)    Weight:   Wt Readings from Last 1 Encounters:  01/18/19 100.9 kg    Ideal Body Weight:  80.9 kg  BMI:  Body mass index is 30.17 kg/m.  Estimated Nutritional Needs:   Kcal:  2015-2220 kcal  Protein:  110-120 grams  Fluid:  >/= 1.8 L/day      Jarome Matin, MS, RD, LDN, [facility] Inpatient Clinical Dietitian Pager # [phone] After hours/weekend pager # [phone]

## 2019-01-18 NOTE — Progress Notes (Signed)
PHARMACY NOTE -  [facility] has been assisting with dosing of Zosyn for aspiration PNA.  Dosage remains stable at 3.375g IV q8 hr and need for further dosage adjustment appears unlikely at present given stable SCr.  Pharmacy will sign off, following peripherally for culture results or dose adjustments. Please reconsult if a change in clinical status warrants re-evaluation of dosage.  Reuel Boom, PharmD, BCPS [phone] 01/18/2019, 12:22 PM   '

## 2019-01-18 NOTE — Evaluation (Signed)
SLP Cancellation Note  Patient Details Name: Tanner Cox. MRN: [id number] DOB: [birth year]   Cancelled treatment:       Reason Eval/Treat Not Completed: Medical issues which prohibited therapy(note pt continues with agitation, tenuous status on HFNC, will continue efforts)   Macario Golds 01/18/2019, 3:00 PM   Luanna Salk, [place] [facility] SLP [place] Pager [phone] Office [phone]

## 2019-01-18 NOTE — Progress Notes (Signed)
Pt refused CBG check and temperature check, attempting to punch and kick staff, thrashing around the bed, and jerking his hands away.  Will continue to monitor patient.

## 2019-01-18 NOTE — Progress Notes (Signed)
Pt refusing CBG check and temperature check. Will re-attempt at 1600.

## 2019-01-18 NOTE — Progress Notes (Signed)
Attempted IV x1, patient is combative and kicking this IV nurse. RN aware.

## 2019-01-18 NOTE — Progress Notes (Signed)
Pt refused CBG check and temperature check. Got very aggressive with this Retail banker. Attempted to punch and kick [place] multiple times. ELink notified. Will continue to monitor patient.

## 2019-01-19 ENCOUNTER — Inpatient Hospital Stay (HOSPITAL_COMMUNITY): Payer: Medicare Other

## 2019-01-19 LAB — CBC
HCT: 34 % — ABNORMAL LOW (ref 39.0–52.0)
Hemoglobin: 10.3 g/dL — ABNORMAL LOW (ref 13.0–17.0)
MCH: 30.5 pg (ref 26.0–34.0)
MCHC: 30.3 g/dL (ref 30.0–36.0)
MCV: 100.6 fL — ABNORMAL HIGH (ref 80.0–100.0)
Platelets: 185 10*3/uL (ref 150–400)
RBC: 3.38 MIL/uL — ABNORMAL LOW (ref 4.22–5.81)
RDW: 14.3 % (ref 11.5–15.5)
WBC: 13.8 10*3/uL — ABNORMAL HIGH (ref 4.0–10.5)
nRBC: 0 % (ref 0.0–0.2)

## 2019-01-19 LAB — GLUCOSE, CAPILLARY
Glucose-Capillary: 89 mg/dL (ref 70–99)
Glucose-Capillary: 88 mg/dL (ref 70–99)
Glucose-Capillary: 89 mg/dL (ref 70–99)
Glucose-Capillary: 98 mg/dL (ref 70–99)
Glucose-Capillary: 105 mg/dL — ABNORMAL HIGH (ref 70–99)
Glucose-Capillary: 112 mg/dL — ABNORMAL HIGH (ref 70–99)
Glucose-Capillary: 102 mg/dL — ABNORMAL HIGH (ref 70–99)

## 2019-01-19 LAB — HEPATIC FUNCTION PANEL
ALT: 65 U/L — ABNORMAL HIGH (ref 0–44)
AST: 55 U/L — ABNORMAL HIGH (ref 15–41)
Albumin: 2.9 g/dL — ABNORMAL LOW (ref 3.5–5.0)
Alkaline Phosphatase: 39 U/L (ref 38–126)
Bilirubin, Direct: 0.3 mg/dL — ABNORMAL HIGH (ref 0.0–0.2)
Indirect Bilirubin: 0.8 mg/dL (ref 0.3–0.9)
Total Bilirubin: 1.1 mg/dL (ref 0.3–1.2)
Total Protein: 5.9 g/dL — ABNORMAL LOW (ref 6.5–8.1)

## 2019-01-19 LAB — BASIC METABOLIC PANEL
Anion gap: 14 (ref 5–15)
BUN: 33 mg/dL — ABNORMAL HIGH (ref 8–23)
CO2: 19 mmol/L — ABNORMAL LOW (ref 22–32)
Calcium: 8.2 mg/dL — ABNORMAL LOW (ref 8.9–10.3)
Chloride: 110 mmol/L (ref 98–111)
Creatinine, Ser: 1.67 mg/dL — ABNORMAL HIGH (ref 0.61–1.24)
GFR calc Af Amer: 43 mL/min — ABNORMAL LOW (ref >60)
GFR calc non Af Amer: 37 mL/min — ABNORMAL LOW (ref >60)
Glucose, Bld: 121 mg/dL — ABNORMAL HIGH (ref 70–99)
Potassium: 3.1 mmol/L — ABNORMAL LOW (ref 3.5–5.1)
Sodium: 143 mmol/L (ref 135–145)

## 2019-01-19 LAB — MAGNESIUM: Magnesium: 2.2 mg/dL (ref 1.7–2.4)

## 2019-01-19 MED ORDER — CHLORDIAZEPOXIDE HCL 5 MG PO CAPS: 5.0000 mg | ORAL_CAPSULE | Freq: Three times a day (TID) | ORAL | Status: DC

## 2019-01-19 MED ORDER — RISPERIDONE 1 MG PO TABS
1.0000 mg | ORAL_TABLET | Freq: Two times a day (BID) | ORAL | Status: DC
  Administered 2019-01-19: 11:00:00 1 mg via ORAL
  Filled 2019-01-19 (×2): qty 1

## 2019-01-19 MED ORDER — METOPROLOL TARTRATE 5 MG/5ML IV SOLN
2.5000 mg | INTRAVENOUS | Status: DC | PRN
  Administered 2019-01-19 (×2): 5 mg via INTRAVENOUS
  Administered 2019-01-20 (×2): 2.5 mg via INTRAVENOUS
  Administered 2019-01-20: 01:00:00 5 mg via INTRAVENOUS
  Administered 2019-01-20: 15:00:00 2.5 mg via INTRAVENOUS
  Filled 2019-01-19 (×5): qty 5

## 2019-01-19 MED ORDER — CHLORDIAZEPOXIDE HCL 5 MG PO CAPS: 5.0000 mg | ORAL_CAPSULE | Freq: Two times a day (BID) | ORAL | Status: DC

## 2019-01-19 MED ORDER — PREDNISONE 20 MG PO TABS: 20.0000 mg | ORAL_TABLET | Freq: Every day | ORAL | Status: DC

## 2019-01-19 MED ORDER — QUETIAPINE FUMARATE 50 MG PO TABS: 25.0000 mg | ORAL_TABLET | Freq: Two times a day (BID) | ORAL | Status: DC

## 2019-01-19 NOTE — Progress Notes (Addendum)
NAME:  Tanner Topper., MRN:  [id number], DOB:  04-15-1932, LOS: 11 ADMISSION DATE:  01/19/2019, CONSULTATION DATE:  01/12/2019 REFERRING MD:  Horris Latino, MD CHIEF COMPLAINT:  Shortness of breath, hemoptysis  Brief History   Mr. Tanner Cox is a 85 year old male with pulmonary fibrosis on CellCept and steroids who presents with chills, shortness of breath and increased home oxygen requirement.  PCCM consulted for small volume hemoptysis.  Past Medical History  COPD, AAA, CAD, Psoriatec arthritis, GERD, nephrolithiasis status post recent stent  Significant Hospital Events   7/10 admitted to [facility] 7/11 Near cardiac arrest, SVT, altered mental status, Seen by [facility] for recommendation on ILD flare, steroids started 7/12 Mild delirium first noted by family 7/13-7/14: Delirium persist 7/15 Overnight increasing O2 requirements, agitation, hypertensive crisis, CTH negative, new hematuria, labile BP 7/16 Bradycardia, precedex d/c'd. FIO2/PEEP decreased. CXR a little worse but may be volume loss from low tidal vol ventilation 7/17: Extubation.  Stopping vancomycin. 7/17-7/20. Agitation remains the primary barrier. Speech better as of 7/20 but still on high flow. On 7/20 transitioning to Haldol over precedex. Added librium  7/21 looking a little better. Adding low dose seroquel and decreasing librium freq. Getting OOB. Steroids transitioned to PO. abx completed  Consults:  PCCM  Procedures:    Significant Diagnostic Tests:  CTA C/A/P 7/8: Emphysematous changes with peripheral reticular opacities CT Head 7/15: Negative  Micro Data:  COVID-19 7/10 - neg MRSA by PCR 7/11 - neg BCX 7/10 >> negative UCx 7/10 -negative BAL 7/15 >> negative  Antimicrobials:   Doxycycline 7/14 >> 7/15 Vanc 7/15 >> 7/17 Cefepime 7/10>> 7/17  Zosyn 7/15 >>7/21  Interim history/subjective:  Still on high flow Seems a little less agitated  Objective   Blood pressure (Abnormal) 148/75, pulse (Abnormal) 104,  temperature (Abnormal) 100.8 F (38.2 C), temperature source Axillary, resp. rate 16, height 6' (1.829 m), weight 97.3 kg, SpO2 95 %.    FiO2 (%):  [70 %-100 %] 70 %   Intake/Output Summary (Last 24 hours) at 01/19/2019 0845 Last data filed at 01/19/2019 0700 Gross per 24 hour  Intake 1303.41 ml  Output 701 ml  Net 602.41 ml   Filed Weights   01/17/19 0433 01/18/19 0500 01/19/19 0500  Weight: 101.9 kg 100.9 kg 97.3 kg    Physical Exam: General 85 year old white male. Still confused, restless and agitated at times HENT NCAT no JVD MMM pulm bibasilar rales. No accessory use still on high flow  Card no MRG RRR abd not tender + bowel sounds no OM Ext brisk CR no edema Neuro sp soft, rhonchus at times. Moves all ext.    Resolved Hospital Problem list   Hemoptysis, Hypertensive Crisis  Assessment & Plan:   Acute on chronic hypoxemic respiratory failure, ILD exacerbation with small volume hemopytsis/ Now with progressive respiratory failure - aspiration event vs progression of ILD vs cardiogenic pulm edema: 7/21 PCXR personally reviewed. Aeration a little improved.   Plan: Day 7/7 zosyn Avoid BIPAP given delirium Wean high flow (sat goal >88%) Out of bed Change back to pred 57m/d (this was baseline before) Holding cellcept for now (prob can resume later this week as long as we feel confidence infection resolved) Continue high flow nasal cannula and wean as tolerated  Paroxysmal Afib with intermittent rapid ventricular response and then also intermittent bradycardia Plan: PRN lopressor No AC given recent hemoptysis   Leukocytosis (on methylprednisolone):  Plan: Trend CBC Empiric treatment for possible respiratory infection  HTN Plan:  PRN hydralazine   L kidney stone s/p stenting, R atrophied kidney CKD, stage III, stable serum creatinine: CVP 2 Plan: Awaiting am chem results Cont strick I&O Holding diuresis for at least another day   Fluid and electrolyte  imbalance: Hypernatremia, hyperchloremia, hypokalemia  7/20 Na 147, free water deficit: ~2.5L Plan Cont d5w at 50 as we await chemistry Replace as indicated   Delirium/ ETOH withdrawal/ possibly exacerbated by steroids/ consider infectious process or hypoxia: Plan: Cont Librium scheduled (but at reduced frequency) Add scheduled low dose Seroquel (I think delirium a bigger issue)  Hx B12 deficiency Plan  Cont replacement  Protein Calorie Malnutrition Plan:  Aspiration precautions Slow progression of diet I believe feeding tube would only be dislodged by the patient making it an equally high risk for aspiration    Best practice:  Diet: NPO Pain/Anxiety/Delirium protocol (if indicated): N/A VAP protocol (if indicated): N/A  DVT prophylaxis: SCDs; will add [place] heparin in am if cbc 7/22 if cont to improve  GI prophylaxis: PPI Glucose control: CBG q4h Mobility: PT/OT Code Status: PARTIAL - no CPR, defibrillation, or ACLS medications. Okay to intubated Family Communication: 7/20 Family discussion Disposition: ICU  Tanner Cox ACNP-BC [place] Pager # [phone] OR # [phone] if no answer

## 2019-01-19 NOTE — Evaluation (Signed)
SLP Cancellation Note  Patient Details Name: Tanner Cox. MRN: [id number] DOB: 10-16-1931   Cancelled treatment:       Reason Eval/Treat Not Completed: Fatigue/lethargy limiting ability to participate(RN reports pt it too lethargic at this time for MBS, will continue efforts next date)  Luanna Salk, [facility] SLP [place] Pager [phone] Office [phone]    Macario Golds 01/19/2019, 11:40 AM

## 2019-01-20 ENCOUNTER — Inpatient Hospital Stay (HOSPITAL_COMMUNITY): Payer: Medicare Other

## 2019-01-20 LAB — GLUCOSE, CAPILLARY
Glucose-Capillary: 86 mg/dL (ref 70–99)
Glucose-Capillary: 113 mg/dL — ABNORMAL HIGH (ref 70–99)
Glucose-Capillary: 121 mg/dL — ABNORMAL HIGH (ref 70–99)
Glucose-Capillary: 118 mg/dL — ABNORMAL HIGH (ref 70–99)

## 2019-01-20 LAB — CBC
HCT: 37 % — ABNORMAL LOW (ref 39.0–52.0)
Hemoglobin: 11 g/dL — ABNORMAL LOW (ref 13.0–17.0)
MCH: 31 pg (ref 26.0–34.0)
MCHC: 29.7 g/dL — ABNORMAL LOW (ref 30.0–36.0)
MCV: 104.2 fL — ABNORMAL HIGH (ref 80.0–100.0)
Platelets: 181 10*3/uL (ref 150–400)
RBC: 3.55 MIL/uL — ABNORMAL LOW (ref 4.22–5.81)
RDW: 14.5 % (ref 11.5–15.5)
WBC: 11.1 10*3/uL — ABNORMAL HIGH (ref 4.0–10.5)
nRBC: 0 % (ref 0.0–0.2)

## 2019-01-20 LAB — BASIC METABOLIC PANEL
Anion gap: 13 (ref 5–15)
BUN: 30 mg/dL — ABNORMAL HIGH (ref 8–23)
CO2: 22 mmol/L (ref 22–32)
Calcium: 8.4 mg/dL — ABNORMAL LOW (ref 8.9–10.3)
Chloride: 107 mmol/L (ref 98–111)
Creatinine, Ser: 1.6 mg/dL — ABNORMAL HIGH (ref 0.61–1.24)
GFR calc Af Amer: 45 mL/min — ABNORMAL LOW (ref >60)
GFR calc non Af Amer: 39 mL/min — ABNORMAL LOW (ref >60)
Glucose, Bld: 123 mg/dL — ABNORMAL HIGH (ref 70–99)
Potassium: 3.3 mmol/L — ABNORMAL LOW (ref 3.5–5.1)
Sodium: 142 mmol/L (ref 135–145)

## 2019-01-20 LAB — MAGNESIUM: Magnesium: 2.3 mg/dL (ref 1.7–2.4)

## 2019-01-20 LAB — VITAMIN B12: Vitamin B-12: 1035 pg/mL — ABNORMAL HIGH (ref 180–914)

## 2019-01-20 MED ORDER — DEXTROSE IN LACTATED RINGERS 5 % IV SOLN
INTRAVENOUS | Status: DC
  Administered 2019-01-20: 11:00:00 via INTRAVENOUS

## 2019-01-20 MED ORDER — AMIODARONE LOAD VIA INFUSION
150.0000 mg | Freq: Once | INTRAVENOUS | Status: AC
  Administered 2019-01-20: 20:00:00 150 mg via INTRAVENOUS
  Filled 2019-01-20: qty 83.34

## 2019-01-20 MED ORDER — METHYLPREDNISOLONE SODIUM SUCC 125 MG IJ SOLR
60.0000 mg | Freq: Two times a day (BID) | INTRAMUSCULAR | Status: DC
  Administered 2019-01-20 – 2019-01-27 (×14): 60 mg via INTRAVENOUS
  Filled 2019-01-20 (×14): qty 2

## 2019-01-20 MED ORDER — HALOPERIDOL LACTATE 5 MG/ML IJ SOLN
1.0000 mg | INTRAMUSCULAR | Status: DC | PRN
  Administered 2019-01-24 – 2019-01-28 (×11): 4 mg via INTRAVENOUS
  Filled 2019-01-20 (×12): qty 1

## 2019-01-20 MED ORDER — ACETAMINOPHEN 325 MG PO TABS: 650.0000 mg | ORAL_TABLET | ORAL | Status: DC | PRN

## 2019-01-20 MED ORDER — METOPROLOL TARTRATE 5 MG/5ML IV SOLN
5.0000 mg | Freq: Once | INTRAVENOUS | Status: AC
  Administered 2019-01-20: 16:00:00 5 mg via INTRAVENOUS

## 2019-01-20 MED ORDER — ACETAMINOPHEN 650 MG RE SUPP
650.0000 mg | RECTAL | Status: DC | PRN
  Filled 2019-01-20: qty 1

## 2019-01-20 MED ORDER — AMIODARONE HCL IN DEXTROSE 360-4.14 MG/200ML-% IV SOLN
30.0000 mg/h | INTRAVENOUS | Status: DC
  Administered 2019-01-21: 08:00:00 30 mg/h via INTRAVENOUS
  Filled 2019-01-20 (×2): qty 200

## 2019-01-20 MED ORDER — AMIODARONE HCL IN DEXTROSE 360-4.14 MG/200ML-% IV SOLN
60.0000 mg/h | INTRAVENOUS | Status: AC
  Administered 2019-01-20 (×2): 60 mg/h via INTRAVENOUS
  Filled 2019-01-20: qty 200

## 2019-01-20 MED ORDER — POTASSIUM CHLORIDE 10 MEQ/100ML IV SOLN
10.0000 meq | INTRAVENOUS | Status: AC
  Administered 2019-01-20 (×3): 10 meq via INTRAVENOUS
  Filled 2019-01-20 (×3): qty 100

## 2019-01-20 NOTE — Progress Notes (Signed)
Called E-link to notify of HR in the 160s. PRN lopressor already given. No MD in the box yet, will call back

## 2019-01-20 NOTE — Progress Notes (Addendum)
[place] Progress Note Patient Name: [name]. DOB: May 21, 1932 MRN: [id number]   Date of Service  01/20/2019  HPI/Events of Note  A 85 year old male with history of atrial fibrillation was admitted to the ICU with hemoptysis.  He is in A. fib with RVR with a heart rate between 140 to 160.   eICU Interventions  Already received a 12.5 mg of IV metoprolol with minimal help.  Ordered amiodarone bolus followed by drip.  Maintain heart rate less than 120.  Already replaced 30 mEq of potassium for potassium of 3.3.      Intervention Category Major Interventions: Arrhythmia - evaluation and management Intermediate Interventions: Communication with other healthcare providers and/or family;Medication change / dose adjustment  Mady Gemma 01/20/2019, 7:32 PM   9:48 PM Fever 102.9 F, completed a course of antibiotics for pneumonia. Ordered blood culture and UA. Increase the frequency of rectal Tylenol from every 6 hours to every 4 hours. Apply ice packs and cooling blanket as required

## 2019-01-20 NOTE — Progress Notes (Signed)
SLP Cancellation Note  Patient Details Name: Tanner Cox. MRN: [id number] DOB: 1932/03/16   Cancelled treatment:       Reason Eval/Treat Not Completed: Fatigue/lethargy limiting ability to participate  Pt with snoring respirations with open mouth posture, provided oral suctioning which did not cause pt to awake consistently.  Will follow for readiness for MBS.   Luanna Salk, MS [facility] SLP Acute Rehab Services Pager [phone] Office [phone]    Macario Golds 01/20/2019, 10:45 AM

## 2019-01-20 NOTE — Progress Notes (Addendum)
Pt's maintenance fluids stopped at this time d/t incompatibility with amiodarone gtt. MD notified. Also spoke with pt's wife Tye [place] regarding afib with RVR and intervention of starting an amiodarone gtt. Also informed wife of pt's fever and administration of tylenol PR. Wife verbalizes understanding and states that she will be coming in to see the patient in about an hour. All questions answered at this time. Will continue to monitor.

## 2019-01-20 NOTE — Progress Notes (Signed)
NAME:  Tanner Cox., MRN:  [id number], DOB:  20-Jun-1932, LOS: 12 ADMISSION DATE:  01/17/2019, CONSULTATION DATE:  01/15/2019 REFERRING MD:  Horris Latino, MD CHIEF COMPLAINT:  Shortness of breath, hemoptysis  Brief History   [name] is a 85 year old male with pulmonary fibrosis on CellCept and steroids who presents with chills, shortness of breath and increased home oxygen requirement.  PCCM consulted for small volume hemoptysis.  Past Medical History  COPD, AAA, CAD, Psoriatec arthritis, GERD, nephrolithiasis status post recent stent  Significant Hospital Events   7/10 admitted to [facility] 7/11 Near cardiac arrest, SVT, altered mental status, Seen by [facility] for recommendation on ILD flare, steroids started 7/12 Mild delirium first noted by family 7/13-7/14: Delirium persist 7/15 Overnight increasing O2 requirements, agitation, hypertensive crisis, CTH negative, new hematuria, labile BP 7/16 Bradycardia, precedex d/c'd. FIO2/PEEP decreased. CXR a little worse but may be volume loss from low tidal vol ventilation 7/17: Extubation.  Stopping vancomycin. 7/17-7/20. Agitation remains the primary barrier. Speech better as of 7/20 but still on high flow. On 7/20 transitioning to Haldol over precedex. Added librium  7/21 looking a little better. Adding low dose seroquel and decreasing librium freq. Getting OOB. Steroids transitioned to PO. abx completed  Consults:  PCCM  Procedures:  ETT 7/15 >> 7/17 Central Line 7/15 >> 7/20  Significant Diagnostic Tests:  CTA C/A/P 7/8: Emphysematous changes with peripheral reticular opacities CT Head 7/15: Negative  Micro Data:  COVID-19 7/10 - neg MRSA by PCR 7/11 - neg BCX 7/10 >> negative UCx 7/10 -negative BAL 7/15 >> negative  Antimicrobials:   Doxycycline 7/14 >> 7/15 Vanc 7/15 >> 7/17 Cefepime 7/10>> 7/17 Zosyn 7/15 >>7/21  Interim history/subjective:  Still delirious, requiring HFNC 100% this morning   Objective   Blood  pressure (Abnormal) 166/61, pulse (Abnormal) 112, temperature 98.9 F (37.2 C), temperature source Axillary, resp. rate (Abnormal) 24, height 6' (1.829 m), weight 97.3 kg, SpO2 (Abnormal) 87 %.    FiO2 (%):  [70 %-100 %] 100 %   Intake/Output Summary (Last 24 hours) at 01/20/2019 0908 Last data filed at 01/20/2019 0600 Gross per 24 hour  Intake 1083.47 ml  Output 925 ml  Net 158.47 ml   Filed Weights   01/17/19 0433 01/18/19 0500 01/19/19 0500  Weight: 101.9 kg 100.9 kg 97.3 kg    Physical Exam: General: 85 year old elderly, ill-appearing male; restless and agitated HEENT: [place]/AT, no JVD, MMM CV: Atrial fibrillation, no murmurs, gallops, or rubs Lungs: Rhonchi throughout, bil basiliar crackles  Abd: Soft, nontender  Ext: Warm, dry, palpable pulses, +1 LE edema  GU: Dark amber/ brown colored urine, decreased urine output Neuro: AOx2 (self, location), follows simple commands, continued delirium/ agitation   Resolved Hospital Problem list   Hemoptysis, Hypertensive Crisis  Assessment & Plan:   Acute on chronic hypoxemic respiratory failure, ILD exacerbation with small volume hemopytsis/ Now with progressive respiratory failure - aspiration event vs progression of ILD vs cardiogenic pulm edema: Completed antibiotics - 7/21, BAL negative Plan: Continue prednisone 20 mg (home dose)  Hold CellCept for now - resume later this week as long as we feel infection is resolved Continue to wean high-flow [place] (sat goal >88%), as tolerated  Push pulmonary hygiene aggressively Avoid BiPAP given delirium Promote out of bed  Paroxysmal Afib with intermittent rapid ventricular response and then also intermittent bradycardia Plan: PRN lopressor for rate control No AC given recent hemoptysis  Leukocytosis (on methylprednisolone):  Completed antibiotics 7/21 Plan: Trend CBC  HTN Plan: PRN hydralazine  L kidney stone s/p stenting, R atrophied kidney CKD, stage III, stable serum  creatinine: Plan: Continue strict I&Os Trend Cr and urine output Hold diuresis for now  Fluid and electrolyte imbalance: Hypernatremia, hyperchloremia, hypokalemia  Plan Continue D5W at 50 ml/hr Trend BMP Replace as indicated  Delirium/ ETOH withdrawal/ possibly exacerbated by steroids/ consider infectious process or hypoxia: Plan: D/c low-dose seroquel  PRN Haldol Frequent reorientation. Family allows to visit to help with delirium  Hx B12 deficiency Plan  D/c Vitamin B12 This is not an acute issue that needs to be addressed at this time  Protein Calorie Malnutrition Plan:  Aspiration precautions Slow progression of diet I believe a feeding tube would only be dislodged by patient making it equally a high risk for aspiration   Best practice:  Diet: NPO Pain/Anxiety/Delirium protocol (if indicated): N/A VAP protocol (if indicated): N/A DVT prophylaxis: SCDs GI prophylaxis: PPI Glucose control: CBG q4h Mobility: PT/OT Code Status: PARTIAL - okay to intubate. No CPR, defibrillation, or ACLS meds Family Communication: 7/21 Disposition: ICU  Patient is critically ill and continues to be agitated and delirious. His oxygen requirements are increasing. He continues to be hypoxic with current HFNC settings at 100% 30L. Starting to become limited in options. Family discussion with wife.   Critical Care time: 35 min  Erick Colace ACNP-BC [place] Pager # [phone] OR # [phone] if no answer]

## 2019-01-21 ENCOUNTER — Inpatient Hospital Stay (HOSPITAL_COMMUNITY): Payer: Medicare Other

## 2019-01-21 DIAGNOSIS — J189 Pneumonia, unspecified organism: Secondary | ICD-10-CM

## 2019-01-21 LAB — URINALYSIS, ROUTINE W REFLEX MICROSCOPIC
Bilirubin Urine: NEGATIVE
Glucose, UA: NEGATIVE mg/dL
Ketones, ur: NEGATIVE mg/dL
Leukocytes,Ua: NEGATIVE
Nitrite: NEGATIVE
Protein, ur: 100 mg/dL — AB
RBC / HPF: >50 RBC/hpf — ABNORMAL HIGH (ref 0–5)
Specific Gravity, Urine: 1.019 (ref 1.005–1.030)
WBC, UA: >50 WBC/hpf — ABNORMAL HIGH (ref 0–5)
pH: 5 (ref 5.0–8.0)

## 2019-01-21 LAB — CBC
HCT: 32.3 % — ABNORMAL LOW (ref 39.0–52.0)
Hemoglobin: 9.5 g/dL — ABNORMAL LOW (ref 13.0–17.0)
MCH: 30.5 pg (ref 26.0–34.0)
MCHC: 29.4 g/dL — ABNORMAL LOW (ref 30.0–36.0)
MCV: 103.9 fL — ABNORMAL HIGH (ref 80.0–100.0)
Platelets: 148 10*3/uL — ABNORMAL LOW (ref 150–400)
RBC: 3.11 MIL/uL — ABNORMAL LOW (ref 4.22–5.81)
RDW: 14.6 % (ref 11.5–15.5)
WBC: 12.9 10*3/uL — ABNORMAL HIGH (ref 4.0–10.5)
nRBC: 0 % (ref 0.0–0.2)

## 2019-01-21 LAB — GLUCOSE, CAPILLARY
Glucose-Capillary: 124 mg/dL — ABNORMAL HIGH (ref 70–99)
Glucose-Capillary: 118 mg/dL — ABNORMAL HIGH (ref 70–99)
Glucose-Capillary: 121 mg/dL — ABNORMAL HIGH (ref 70–99)
Glucose-Capillary: 164 mg/dL — ABNORMAL HIGH (ref 70–99)
Glucose-Capillary: 160 mg/dL — ABNORMAL HIGH (ref 70–99)
Glucose-Capillary: 143 mg/dL — ABNORMAL HIGH (ref 70–99)
Glucose-Capillary: 163 mg/dL — ABNORMAL HIGH (ref 70–99)

## 2019-01-21 LAB — MAGNESIUM: Magnesium: 2.2 mg/dL (ref 1.7–2.4)

## 2019-01-21 LAB — BASIC METABOLIC PANEL
Anion gap: 14 (ref 5–15)
BUN: 50 mg/dL — ABNORMAL HIGH (ref 8–23)
CO2: 20 mmol/L — ABNORMAL LOW (ref 22–32)
Calcium: 8.3 mg/dL — ABNORMAL LOW (ref 8.9–10.3)
Chloride: 110 mmol/L (ref 98–111)
Creatinine, Ser: 1.99 mg/dL — ABNORMAL HIGH (ref 0.61–1.24)
GFR calc Af Amer: 34 mL/min — ABNORMAL LOW (ref >60)
GFR calc non Af Amer: 30 mL/min — ABNORMAL LOW (ref >60)
Glucose, Bld: 190 mg/dL — ABNORMAL HIGH (ref 70–99)
Potassium: 3.9 mmol/L (ref 3.5–5.1)
Sodium: 144 mmol/L (ref 135–145)

## 2019-01-21 LAB — PHOSPHORUS: Phosphorus: 3.1 mg/dL (ref 2.5–4.6)

## 2019-01-21 LAB — PROCALCITONIN: Procalcitonin: 2.72 ng/mL

## 2019-01-21 MED ORDER — ADULT MULTIVITAMIN LIQUID CH
15.0000 mL | Freq: Every day | ORAL | Status: DC
  Administered 2019-01-22: 15 mL
  Filled 2019-01-21 (×6): qty 15

## 2019-01-21 MED ORDER — VITAL HIGH PROTEIN PO LIQD: 1000.0000 mL | ORAL | Status: DC

## 2019-01-21 MED ORDER — BISACODYL 10 MG RE SUPP: 10.0000 mg | Freq: Every day | RECTAL | Status: DC | PRN

## 2019-01-21 MED ORDER — VANCOMYCIN HCL 10 G IV SOLR
1250.0000 mg | INTRAVENOUS | Status: DC
  Filled 2019-01-21: qty 1250

## 2019-01-21 MED ORDER — DEXTROSE 5 % IV SOLN
INTRAVENOUS | Status: DC
  Administered 2019-01-21 – 2019-01-25 (×3): via INTRAVENOUS

## 2019-01-21 MED ORDER — DILTIAZEM HCL 100 MG IV SOLR
5.0000 mg/h | INTRAVENOUS | Status: DC
  Administered 2019-01-21: 5 mg/h via INTRAVENOUS
  Administered 2019-01-21 – 2019-01-25 (×8): 10 mg/h via INTRAVENOUS
  Administered 2019-01-25: 15 mg/h via INTRAVENOUS
  Administered 2019-01-26: 10 mg/h via INTRAVENOUS
  Administered 2019-01-26 (×2): 15 mg/h via INTRAVENOUS
  Administered 2019-01-27: 5 mg/h via INTRAVENOUS
  Filled 2019-01-21 (×16): qty 100

## 2019-01-21 MED ORDER — VANCOMYCIN HCL 10 G IV SOLR
2000.0000 mg | Freq: Once | INTRAVENOUS | Status: AC
  Administered 2019-01-21: 2000 mg via INTRAVENOUS
  Filled 2019-01-21: qty 2000

## 2019-01-21 MED ORDER — SODIUM CHLORIDE 3 % IN NEBU
4.0000 mL | INHALATION_SOLUTION | Freq: Two times a day (BID) | RESPIRATORY_TRACT | Status: AC
  Administered 2019-01-21 – 2019-01-23 (×6): 4 mL via RESPIRATORY_TRACT
  Filled 2019-01-21 (×6): qty 4

## 2019-01-21 MED ORDER — OSMOLITE 1.5 CAL PO LIQD
1000.0000 mL | ORAL | Status: DC
  Filled 2019-01-21 (×5): qty 1000

## 2019-01-21 MED ORDER — PRO-STAT SUGAR FREE PO LIQD
30.0000 mL | Freq: Three times a day (TID) | ORAL | Status: DC
  Administered 2019-01-22: 11:00:00 30 mL
  Filled 2019-01-21 (×2): qty 30

## 2019-01-21 MED ORDER — PANTOPRAZOLE SODIUM 40 MG IV SOLR
40.0000 mg | INTRAVENOUS | Status: DC
  Administered 2019-01-21 – 2019-01-26 (×6): 40 mg via INTRAVENOUS
  Filled 2019-01-21 (×6): qty 40

## 2019-01-21 MED ORDER — LEVALBUTEROL HCL 0.63 MG/3ML IN NEBU
0.6300 mg | INHALATION_SOLUTION | Freq: Two times a day (BID) | RESPIRATORY_TRACT | Status: DC
  Administered 2019-01-21 – 2019-01-27 (×13): 0.63 mg via RESPIRATORY_TRACT
  Filled 2019-01-21 (×13): qty 3

## 2019-01-21 MED ORDER — SODIUM CHLORIDE 0.9 % IV SOLN
1.0000 g | Freq: Two times a day (BID) | INTRAVENOUS | Status: DC
  Administered 2019-01-21 – 2019-01-26 (×11): 1 g via INTRAVENOUS
  Filled 2019-01-21 (×11): qty 1

## 2019-01-21 NOTE — Progress Notes (Signed)
Nutrition Follow-up  DOCUMENTATION CODES:   Obesity unspecified  INTERVENTION:  - will order TF: 30 ml prostat TID with Osmolite 1.5 @ 20 ml/hr to advance by 10 ml every 8 hours to reach goal rate of 50 ml/hr. - at goal rate, this regimen will provide 2100 kcal, 120 grams protein, and 914 ml free water.   NUTRITION DIAGNOSIS:   Inadequate oral intake related to inability to eat as evidenced by NPO status. -ongoing  GOAL:   Patient will meet greater than or equal to 90% of their needs -unmet/unable to meet at this time  MONITOR:   TF tolerance, Labs, Weight trends  REASON FOR ASSESSMENT:   Consult Enteral/tube feeding initiation and management  ASSESSMENT:   85 year old male with multiple comorbidities including history of HTN, COPD with pulmonary fibrosis, chronic hypoxic failure on 3-4 L of O2 at home, AAA, CAD, nephrolithiasis, recent left ureteric stent placement and planned staged procedure by urology, psoriatic arthritis, GERD. He presented to the ED with complaints of worsening SOB, productive cough, and one episode of blood-tinged sputum. He was found to have exacerbation of underlying interstitial lung disease. COVID-19 negative.  Significant Events: 7/10- admission 7/15- intubation, OGT placement; bronchoscopy 7/16- RD assessment for TF 7/17- extubation, OGT removal 7/23- small bore NGT placement and TF initiation   Weight continues to trend down from admission weight. Current estimated nutrition needs remain appropriate. Patient has been NPO and without nutrition since extubation on 7/17 (5 days). Communicated with SLP yesterday concerning patient. She was unable to work with patient for swallow eval yesterday or 7/21 f/t lethargy/sleeping d/t medication administration for patient who has been frequently agitated.    Per notes: - hypoxia with slight improvement--treated for HCAP and aspiration - acute encephalopathy--thought to be ICU delirium - AKI on CKD stage  3 - concern that he will be unsafe for oral nutrition for the foreseeable future - afib with RVR - new fever with concern for UTI - DNR/DNI   Labs reviewed; CBGs: 121 and 164 mg/dl today, 7/22--K; 3.3 mmol/l, BUN: 30 mg/dl, creatinine: 1.6 mg/dl, Ca: 8.4 mg/dl, GFR: 39 ml/min. Medications reviewed; 1 mg IV folic acid/day, 60 mg solu-medrol BID, daily multivitamin with minerals, 10 mEq IV KCl x3 runs 7/22, 100 mg IV thiamine/day. IVF; D5-LR @ 50 ml/hr (204 kcal).    Diet Order:   Diet Order            Diet NPO time specified Except for: Ice Chips  Diet effective now              EDUCATION NEEDS:   No education needs have been identified at this time  Skin:  Skin Assessment: Reviewed RN Assessment  Last BM:  7/22  Height:   Ht Readings from Last 1 Encounters:  01/13/19 6' (1.829 m)    Weight:   Wt Readings from Last 1 Encounters:  01/21/19 98.8 kg    Ideal Body Weight:  80.9 kg  BMI:  Body mass index is 29.54 kg/m.  Estimated Nutritional Needs:   Kcal:  2015-2220 kcal  Protein:  110-120 grams  Fluid:  >/= 1.8 L/day     Jarome Matin, MS, RD, LDN, [facility] Inpatient Clinical Dietitian Pager # [phone] After hours/weekend pager # [phone]

## 2019-01-21 NOTE — Progress Notes (Signed)
Pharmacy Antibiotic Note  Tanner Cox. is a 85 y.o. male with PMH pulmonary fibrosis on PTA steroids, admitted on 01/15/2019 for respiratory failure d/t ILD exacerbation. Initially treated with 2d vanc/5d Zosyn to r/o aspiration (completed 7/20) but now with new fevers to 102.9 overnight. Pharmacy has been consulted for meropenem and vancomycin dosing. SCr unchanged since previous vancomycin.  Plan:  Vancomycin 2000 mg IV now, then 1250 mg IV q24 hr (est AUC 500 based on SCr 1.6; Vd 0.72)  Measure vancomycin AUC at steady state as indicated  Meropenem 1g IV q12 hr  SCr q48 hr while on vanc  Height: 6' (182.9 cm) Weight: 217 lb 13 oz (98.8 kg) IBW/kg (Calculated) : 77.6  Temp (24hrs), Avg:99.8 F (37.7 C), Min:97.7 F (36.5 C), Max:102.9 F (39.4 C)  Recent Labs  Lab 01/15/19 0517 01/15/19 0541 01/17/19 0427 01/18/19 0516 01/19/19 0236 01/19/19 1140 01/20/19 0939  WBC 10.1  --  12.6* 9.9 13.8*  --  11.1*  CREATININE  --  1.84* 1.69* 1.73*  --  1.67* 1.60*    Estimated Creatinine Clearance: 41.1 mL/min (A) (by C-G formula based on SCr of 1.6 mg/dL (H)).    Allergies  Allergen Reactions  . Oxycodone-Acetaminophen Nausea And Vomiting  . Sulfa Antibiotics Other (See Comments)    Per wife, pt has a hypersensitivity  . Codeine Rash  . Escitalopram Hives and Rash  . Sulfamethoxazole-Trimethoprim Nausea Only    Antimicrobials this admission: 7/10 cefepime >> 7/11 7/8 ancef x 1 7/11 doxy >> missed all doses 7/12 & 7/13>> 7/15 7/11 azith x 1 7/15 Vanc>> 7/17; resume 7/23 7/15 zosyn >> 7/23 meropenem >>   Dose adjustments this admission:  Microbiology results: 7/8 MRSA PCR neg 7/11 MRSA PCR neg 7/11 UCx NGF 7/10 BCx2: ngF 7/15 resp cx BAL: normal flora F 7/15 AFB: neg 7/15 fungus cx: sent 7/22 BCx: ngtd  Thank you for allowing pharmacy to be a part of this patient's care.  Reuel Boom, PharmD, BCPS [phone] 01/21/2019, 9:38 AM

## 2019-01-21 NOTE — Progress Notes (Signed)
  Speech Language Pathology Treatment: Dysphagia  Patient Details Name: Joab Carden. MRN: [id number] DOB: [birth year] Today's Date: 01/21/2019 Time: 2409-7353 SLP Time Calculation (min) (ACUTE ONLY): 20 min  Assessment / Plan / Recommendation Clinical Impression  Patient seen to address dysphagia goals with daughter present at bedside for education regarding dysphagia and aspiration precautions. Patient has HFNC, but is also breathing through mouth. He is fussy, irritable, resistant and refusing most of oral care, but would accept toothette sponges of water. He exhibited immediate and delayed cough but amount was very small and no impact on voice was noted. SLP provided oral care as patient allowed and educated his daughter on importance of oral care and reasoning behind him not being safe for PO's at this time.    HPI HPI: pt is an 85 yo male adm to [facility] with AMS, required intubation from 7/15-7/17/2020.  He has needed HFNC warmed and on/off Bipap since extubation.  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 Swallow evaluation ordered.  His code status is partial - Bipap/intubation only no CPR.  Swallow evaluation ordered.  Pt has been confused and agitated during hospital stay.      SLP Plan  Continue with current plan of care       Recommendations  Diet recommendations: NPO Medication Administration: Via alternative means                Oral Care Recommendations: Oral care QID Follow up Recommendations: Skilled Nursing facility SLP Visit Diagnosis: Dysphagia, oropharyngeal phase (R13.12) Plan: Continue with current plan of care       GO                Dannial Monarch 01/21/2019, 6:08 PM   Sonia Baller, MA, CCC-SLP Speech Therapy [facility]

## 2019-01-21 NOTE — Progress Notes (Addendum)
NAME:  Tanner Dorce., MRN:  [id number], DOB:  October 02, 1931, LOS: 78 ADMISSION DATE:  01/24/2019, CONSULTATION DATE:  01/21/2019 REFERRING MD:  Horris Latino, MD CHIEF COMPLAINT:  Shortness of breath, hemoptysis  Brief History   Tanner Cox is a 85 year old male with pulmonary fibrosis on CellCept and steroids who presents with chills, shortness of breath and increased home oxygen requirement.  PCCM consulted for small volume hemoptysis.  Past Medical History  COPD, AAA, CAD, Psoriatec arthritis, GERD, nephrolithiasis status post recent stent  Significant Hospital Events   7/10 admitted to [facility] 7/11 Near cardiac arrest, SVT, altered mental status, Seen by [facility] for recommendation on ILD flare, steroids started 7/12 Mild delirium first noted by family 7/13-7/14: Delirium persist 7/15 Overnight increasing O2 requirements, agitation, hypertensive crisis, CTH negative, new hematuria, labile BP 7/16 Bradycardia, precedex d/c'd. FIO2/PEEP decreased. CXR a little worse but may be volume loss from low tidal vol ventilation 7/17: Extubation.  Stopping vancomycin. 7/17-7/20. Agitation remains the primary barrier. Speech better as of 7/20 but still on high flow. On 7/20 transitioning to Haldol over precedex. Added librium  7/21 looking a little better. Adding low dose seroquel and decreasing librium freq. Getting OOB. Steroids transitioned to PO. abx completed 7/22 HR  afib 160's, amio gtt started, febrile blood and urine Cx sent; resuming empiric HCAP coverage  Consults:  PCCM  Procedures:  ETT 7/15 >> 7/17 Central Line 7/15 >> 7/20  Significant Diagnostic Tests:  CTA C/A/P 7/8: Emphysematous changes with peripheral reticular opacities CT Head 7/15: Negative  Micro Data:  COVID-19 7/10 - neg MRSA by PCR 7/11 - neg BCX 7/10 >> negative UCx 7/10 -negative BAL 7/15 >> negative BCx 7/22 >> UCx 7/22 >>  Antimicrobials:  Doxycycline 7/14 >> 7/15 Vanc 7/15 >> 7/17 Cefepime 7/10>> 7/17  Zosyn 7/15 >>7/21 vanc 7/22>> merrem 7/22>>>  Interim history/subjective:  Overnight HR afib 160's, given amiodarone bolus and gtt,  Febrile, blood and urine cx sent.  Remains hypoxic on HFNC 100% 35L More lucid today  Objective   Blood pressure (Abnormal) 137/93, pulse (Abnormal) 115, temperature 99.1 F (37.3 C), temperature source Axillary, resp. rate (Abnormal) 26, height 6' (1.829 m), weight 98.8 kg, SpO2 94 %.    FiO2 (%):  [100 %] 100 %   Intake/Output Summary (Last 24 hours) at 01/21/2019 0817 Last data filed at 01/21/2019 0600 Gross per 24 hour  Intake 1119.12 ml  Output 575 ml  Net 544.12 ml   Filed Weights   01/18/19 0500 01/19/19 0500 01/21/19 0500  Weight: 100.9 kg 97.3 kg 98.8 kg    Physical Exam: General: 85 year old obese elderly male, ill-appearing HEENT: [place]/AT, hoarse voice, no JVD, MMM CV: atrial fibrillation, no murmurs, gallops, or rubs Lungs: Rhonchi throughout on HFNC 100% 30L Abd: soft, nontender Ext: warm, dry, palpable pulses GU: Dark amber/brown colored urine, decreased urine output Neuro: AOx4, follows simple commands, more lucid   Resolved Hospital Problem list   Hemoptysis, Hypertensive Crisis, Hx of B12 deficiency   Assessment & Plan:   Acute on chronic hypoxemic respiratory failure, ILD exacerbation with small volume hemopytsis/ Now with progressive respiratory failure - aspiration event vs progression of ILD vs cardiogenic pulm edema: Completed antibiotics - 7/21, Pending blood and urine Cx - 7/22 Plan: Continue methylprednisolone Hold CellCept for now Resume HCAP coverage  Continue to wean [facility] (sat goal >88%), as tolerated Push pulmonary hygiene aggressively  PRN Xopenex Avoid BiPAP given delirium Promote out of bed Avoid medication that can  worsen his ILD, such as amiodarone   Paroxysmal Afib with intermittent rapid ventricular response and then also intermittent bradycardia Plan: Start Cardizem gtt, d/c amio Avoid  amiodarone d/t worsening ILD PRN lopressor for rate control No AC given recent hemoptysis  Leukocytosis (on methylprednisolone):  Completed antibiotics 7/21 Plan: Trend CBC  HTN Plan: PRN hydralazine  L kidney stone s/p stenting, R atrophied kidney CKD, stage III, stable serum creatinine: Plan: Strict I&O's Trend Cr and urine output Hold diuresis for now  Fluid and electrolyte imbalance: Hypernatremia, hyperchloremia, hypokalemia  Plan Continue D5W-LR at 50 ml/hr  Trend BMP Replace, as indicated  Delirium/ ETOH withdrawal/ possibly exacerbated by steroids/ consider infectious process or hypoxia: Plan: Continue folic acid, MVI, thiamine PRN haldol for agitation  Protein Calorie Malnutrition Plan:  Aspiration precautions Slow progression of diet Start tube feeding, as patient tolerates   Best practice:  Diet: NPO Pain/Anxiety/Delirium protocol (if indicated): N/A VAP protocol (if indicated): N/A DVT prophylaxis: SCDs GI prophylaxis: PPI Glucose control: CBG q4h Mobility: PT/OT Code Status: DNR Family Communication: 7/23 Disposition: ICU  Patient remains critically ill requiring high flow nasal cannula at 100% 40L while oxygen saturation is 90-94%. More lucid today. Overnight HR increased to 160's afib, given amiodarone bolus and drip. Blood and urine cultures sent. Continue providing supportive care, continue PT/OT, start tube feeds, and wean O2 as much as possible. Continue family discussions as options are now limited.   Critical Care time: 35 min  Erick Colace ACNP-BC [place] Pager # [phone] OR # [phone] if no answer

## 2019-01-21 NOTE — Progress Notes (Signed)
85 year old remote smoker from [place], was being treated for ILD/IPAF with CellCept and prednisone (Dr. Winona Legato at [facility] ) and Cosentyx for longstanding psoriatic arthritis admitted 7/10 for "ILD flare" course complicated by delirium requiring mechanical ventilation 7/15. He was extubated 7/17 but has had persistent delirium and increased oxygen requirements. He was treated with 11 days of antibiotics initially cefepime and then Zosyn , BAL cultures were negative  Developed RVR overnight and placed on amiodarone drip. Febrile 102, cultures were drawn, more lucid this morning, follows commands able to raise both upper extremities, stronger cough with purulent sputum which needs to be suctioned out, on 35 L high flow, bibasal coarse crackles persist, S1-S2 regular, 1+ edema  Chest x-ray 7/22 personally reviewed which shows unchanged bilateral interstitial infiltrates, more alveolar consolidation at the left base.  Labs show more than 50 white cells in urine, mild leukocytosis.  Impression/plan  Acute hypoxic respiratory failure-appears marginally improved hypoxia, has been treated for H CAP and aspiration, ILD flare remains a concern in the background. CellCept has been held, Solu-Medrol increased to 60 every 12 Continue to wean high flow as he tolerates Encouraged chest PT and oral suctioning.  Acute encephalopathy-favor ICU delirium here, minimizing use of sedative agents and using Haldol as needed has not required last 24 hours, he appears to be more lucid We will try to mobilize as much as possible during the daytime. Unfortunately nutrition is a concern and will attempt placement of smallbore NG tube to help to feed.  I doubt that he will clear swallow evaluation or have oral intake otherwise  AKI on CKD stage III-will limit fluids and use 1 dose of Lasix again based on be made.  Atrial fibrillation RVR-amiodarone is not a long-term option, will use rate control with IV Cardizem  drip if blood pressure permits  New fever-concern for nosocomial UTI but at CAP also possible given purulent secretions, will restart meropenem and vancomycin while awaiting cultures and obtain procalcitonin.  Goals of care-discussed with wife Juliann Pulse and today with daughter, they are realistic, DNR has been issued including no intubation, but they do desire full medical care while there are even slim hopes of recovery  The patient is critically ill with multiple organ systems failure and requires high complexity decision making for assessment and support, frequent evaluation and titration of therapies, application of advanced monitoring technologies and extensive interpretation of multiple databases. Critical Care Time devoted to patient care services described in this note independent of APP/resident  time is 35 minutes.   Leanna Sato Elsworth Soho MD [phone]

## 2019-01-21 NOTE — Progress Notes (Signed)
Attempted to place small bore feeding tube with charge nurse Christian. First attempt went in mouth. Patient didn't tolerate second attempt. Third attempt no air could be heard in abdomen. Laurey Arrow, NP notified. Will continue to keep NPO at this time.

## 2019-01-22 ENCOUNTER — Inpatient Hospital Stay (HOSPITAL_COMMUNITY): Payer: Medicare Other

## 2019-01-22 LAB — BASIC METABOLIC PANEL
Anion gap: 11 (ref 5–15)
BUN: 52 mg/dL — ABNORMAL HIGH (ref 8–23)
CO2: 22 mmol/L (ref 22–32)
Calcium: 8.7 mg/dL — ABNORMAL LOW (ref 8.9–10.3)
Chloride: 112 mmol/L — ABNORMAL HIGH (ref 98–111)
Creatinine, Ser: 1.93 mg/dL — ABNORMAL HIGH (ref 0.61–1.24)
GFR calc Af Amer: 36 mL/min — ABNORMAL LOW (ref >60)
GFR calc non Af Amer: 31 mL/min — ABNORMAL LOW (ref >60)
Glucose, Bld: 153 mg/dL — ABNORMAL HIGH (ref 70–99)
Potassium: 4 mmol/L (ref 3.5–5.1)
Sodium: 145 mmol/L (ref 135–145)

## 2019-01-22 LAB — CBC
HCT: 31 % — ABNORMAL LOW (ref 39.0–52.0)
Hemoglobin: 9.2 g/dL — ABNORMAL LOW (ref 13.0–17.0)
MCH: 30 pg (ref 26.0–34.0)
MCHC: 29.7 g/dL — ABNORMAL LOW (ref 30.0–36.0)
MCV: 101 fL — ABNORMAL HIGH (ref 80.0–100.0)
Platelets: 197 10*3/uL (ref 150–400)
RBC: 3.07 MIL/uL — ABNORMAL LOW (ref 4.22–5.81)
RDW: 14.5 % (ref 11.5–15.5)
WBC: 11.1 10*3/uL — ABNORMAL HIGH (ref 4.0–10.5)
nRBC: 0 % (ref 0.0–0.2)

## 2019-01-22 LAB — GLUCOSE, CAPILLARY
Glucose-Capillary: 141 mg/dL — ABNORMAL HIGH (ref 70–99)
Glucose-Capillary: 137 mg/dL — ABNORMAL HIGH (ref 70–99)
Glucose-Capillary: 146 mg/dL — ABNORMAL HIGH (ref 70–99)
Glucose-Capillary: 118 mg/dL — ABNORMAL HIGH (ref 70–99)

## 2019-01-22 LAB — PROCALCITONIN: Procalcitonin: 2.32 ng/mL

## 2019-01-22 LAB — PHOSPHORUS: Phosphorus: 3 mg/dL (ref 2.5–4.6)

## 2019-01-22 LAB — MAGNESIUM: Magnesium: 2.6 mg/dL — ABNORMAL HIGH (ref 1.7–2.4)

## 2019-01-22 MED ORDER — SODIUM CHLORIDE 0.9 % IV SOLN
INTRAVENOUS | Status: DC | PRN
  Administered 2019-01-22: 11:00:00 250 mL via INTRAVENOUS

## 2019-01-22 MED ORDER — VANCOMYCIN HCL IN DEXTROSE 1-5 GM/200ML-% IV SOLN
1000.0000 mg | INTRAVENOUS | Status: AC
  Administered 2019-01-22 – 2019-01-26 (×5): 1000 mg via INTRAVENOUS
  Filled 2019-01-22 (×5): qty 200

## 2019-01-22 NOTE — Progress Notes (Signed)
Pharmacy Antibiotic Note  Tanner Cox. is a 85 y.o. male with PMH pulmonary fibrosis on PTA steroids, admitted on 01/07/2019 for respiratory failure d/t ILD exacerbation. Initially treated with 2d vanc/5d Zosyn to r/o aspiration (completed 7/20) but now with new fevers to 102.9 overnight 7/22 Pharmacy has been consulted for meropenem and vancomycin dosing. SCr unchanged since previous vancomycin.  Today, 01/22/2019:  SCr increased  PCT remains elevated but slightly improved from yesterday  WBC elevated on steroids  No further fevers  Plan:  Adjust vancomycin to 1000 mg IV q24 hr (est AUC 519 based on SCr 1.93; using Vd 0.65 d/t borderline BMI)  Measure vancomycin AUC at steady state as indicated  Continue ,meropenem 1g IV q12 hr  SCr q48 hr while on vanc  Height: 6' (182.9 cm) Weight: 218 lb 7.6 oz (99.1 kg) IBW/kg (Calculated) : 77.6  Temp (24hrs), Avg:98.4 F (36.9 C), Min:97.7 F (36.5 C), Max:98.9 F (37.2 C)  Recent Labs  Lab 01/18/19 0516 01/19/19 0236 01/19/19 1140 01/20/19 0939 01/21/19 1034 01/22/19 0609  WBC 9.9 13.8*  --  11.1* 12.9* 11.1*  CREATININE 1.73*  --  1.67* 1.60* 1.99* 1.93*    Estimated Creatinine Clearance: 34.1 mL/min (A) (by C-G formula based on SCr of 1.93 mg/dL (H)).    Allergies  Allergen Reactions  . Oxycodone-Acetaminophen Nausea And Vomiting  . Sulfa Antibiotics Other (See Comments)    Per wife, pt has a hypersensitivity  . Codeine Rash  . Escitalopram Hives and Rash  . Sulfamethoxazole-Trimethoprim Nausea Only    Antimicrobials this admission: 7/10 cefepime >> 7/11 7/8 ancef x 1 7/11 doxy >> missed all doses 7/12 & 7/13>> 7/15 7/11 azith x 1 7/15 Vanc>> 7/17; resume 7/23 7/15 zosyn >> 7/23 meropenem >>   Dose adjustments this admission: 7/24 adjust vanc to 1g q24 with worsening CrCl  Microbiology results: 7/8 MRSA PCR neg 7/11 MRSA PCR neg 7/11 UCx NGF 7/10 BCx2: ngF 7/15 resp cx BAL: normal flora F 7/15  AFB: neg 7/15 fungus cx: sent 7/22 BCx: ngtd  Thank you for allowing pharmacy to be a part of this patient's care.  Reuel Boom, PharmD, BCPS [phone] 01/22/2019, 9:12 AM

## 2019-01-22 NOTE — Progress Notes (Addendum)
NAME:  Tanner Frericks., MRN:  [id number], DOB:  April 15, 1932, LOS: 44 ADMISSION DATE:  01/07/2019, CONSULTATION DATE:  01/04/2019 REFERRING MD:  Horris Latino, MD CHIEF COMPLAINT:  Shortness of breath, hemoptysis  Brief History   85 year old remote smoker from [place], was being treated for ILD/IPAF with CellCept and prednisone (Dr. Winona Legato at [facility] ) and Cosentyx for longstanding psoriatic arthritis admitted 7/10 for "ILD flare" course complicated by delirium requiring mechanical ventilation 7/15. He was extubated 7/17 but  had persistent delirium and increased oxygen requirements attributed to aspiration. He was treated with 11 days of antibiotics initially cefepime and then Zosyn , BAL cultures were negative  Past Medical History  COPD, AAA, CAD, Psoriatec arthritis, GERD, nephrolithiasis status post recent stent  Significant Hospital Events   7/10 admitted to [facility] 7/11 Near cardiac arrest, SVT, altered mental status, Seen by [facility] for recommendation on ILD flare, steroids started 7/12 Mild delirium first noted by family 7/13-7/14: Delirium persist 7/15 Overnight increasing O2 requirements, agitation, hypertensive crisis, CTH negative, new hematuria, labile BP 7/16 Bradycardia, precedex d/c'd. FIO2/PEEP decreased. CXR a little worse but may be volume loss from low tidal vol ventilation 7/17: Extubation.  Stopping vancomycin. 7/17-7/20. Agitation remains the primary barrier. Speech better as of 7/20 but still on high flow. On 7/20 transitioning to Haldol over precedex. Added librium  7/21 looking a little better. Adding low dose seroquel and decreasing librium freq. Getting OOB. Steroids transitioned to PO. abx completed 7/22 HR  afib 160's, amio gtt started, febrile blood and urine Cx sent; resuming empiric HCAP coverage  Consults:  PCCM  Procedures:  ETT 7/15 >> 7/17 Central Line 7/15 >> 7/20  Significant Diagnostic Tests:  CTA C/A/P 7/8: Emphysematous changes with peripheral  reticular opacities CT Head 7/15: Negative  Micro Data:  COVID-19 7/10 - neg MRSA by PCR 7/11 - neg BCX 7/10 >> negative UCx 7/10 -negative BAL 7/15 >> negative BCx 7/22 >> UCx 7/22 >>  Antimicrobials:  Doxycycline 7/14 >> 7/15 Vanc 7/15 >> 7/17 Cefepime 7/10>> 7/17 Zosyn 7/15 >>7/21 vanc 7/22>> merrem 7/22>>>  Interim history/subjective:   Defervesced Was able to sleep overnight Remains on high flow oxygen 100% / 40 L RNs unable to place NG tube  Objective   Blood pressure (!) 146/60, pulse (!) 29, temperature 98.2 F (36.8 C), temperature source Oral, resp. rate 20, height 6' (1.829 m), weight 99.1 kg, SpO2 96 %.    FiO2 (%):  [100 %] 100 %   Intake/Output Summary (Last 24 hours) at 01/22/2019 0849 Last data filed at 01/22/2019 9485 Gross per 24 hour  Intake 1340.38 ml  Output 250 ml  Net 1090.38 ml   Filed Weights   01/19/19 0500 01/21/19 0500 01/22/19 0455  Weight: 97.3 kg 98.8 kg 99.1 kg    Physical Exam: General: Elderly obese man, ill-appearing HEENT: [place]/AT, hoarse voice, no JVD, MMM CV: atrial fibrillation, no murmurs, gallops, or rubs Lungs: Rhonchi throughout , bilateral basal crackles, few scattered rhonchi Abd: soft, nontender Ext: warm, dry, palpable pulses GU: Dark amber/brown colored urine Neuro: AOx4, follows simple commands, more lucid  Chest x-ray 7/24 personally reviewed which shows bilateral interstitial infiltrates unchanged, some alveolar consolidation left lower lobe   Resolved Hospital Problem list   Hemoptysis, Hypertensive Crisis, Hx of B12 deficiency   Assessment & Plan:   Acute on chronic hypoxemic respiratory failure, ILD exacerbation with small volume hemopytsis- aspiration event vs progression of ILD , doubt pulm edema: Completed antibiotics - 7/21, Pending blood and  urine Cx - 7/22 Plan: Continue methylprednisolone 60 every 12 Hold CellCept for now Resume HCAP coverage  Continue to wean [facility] (sat goal >88%), as  tolerated Encourage oral suctioning and chest PT PRN Xopenex Not a candidate for BiPAP given delirium Promote out of bed    Paroxysmal Afib with intermittent rapid ventricular response and then also intermittent bradycardia Plan: ct Cardizem gtt Avoid amiodarone d/t worsening ILD PRN lopressor for rate control No AC given recent hemoptysis  ICU Delirium/ ETOH withdrawal/ possibly exacerbated by steroids/ consider infectious process or hypoxia: Plan: Continue folic acid, MVI, thiamine PRN haldol for agitation Encourage PT mobilization  Protein Calorie Malnutrition Dysphagia-pre-existing vocal cord paralysis Plan:  Aspiration precautions Doubt he will cleared swallow eval  Place small bore NG tube and start tube feeds -if unable may have to consider short term TNA  New fever 7/23 -nosocomial UTI versus HCAP Slight high procalcitonin -Restarted meropenem and vancomycin, await cultures then simplify  L kidney stone s/p stenting, R atrophied kidney CKD, stage III, stable serum creatinine: Plan: Strict I&O's Lasix 40 x 1   Hypernatremia, hyperchloremia, hypokalemia  Plan repleted     Best practice:  Diet: NPO Pain/Anxiety/Delirium protocol (if indicated): N/A VAP protocol (if indicated): N/A DVT prophylaxis: SCDs GI prophylaxis: PPI Glucose control: CBG q4h Mobility: PT/OT Code Status: DNR Family Communication: 7/24  Disposition: ICU  Summary-he remains severely hypoxic, he has been treated with antibiotics for aspiration pneumonia and with steroids for ILD flare, care limitations have been set.  Hope is that hypoxia improves over the next 2 to 3 days, we will try to build up his nutrition in the interim, clearly if he worsens we will progress to full comfort care-family agrees with this plan  The patient is critically ill with multiple organ systems failure and requires high complexity decision making for assessment and support, frequent evaluation and titration  of therapies, application of advanced monitoring technologies and extensive interpretation of multiple databases. Critical Care Time devoted to patient care services described in this note independent of APP/resident  time is 32 minutes.   Kara Mead MD. Shade Flood. [place] Pager [phone] If no response call [phone]   01/22/2019

## 2019-01-22 NOTE — Progress Notes (Addendum)
PT Cancellation Note  Patient Details Name: Tanner Cox. MRN: [id number] DOB: [birth year]   Cancelled Treatment:     PT order received but eval deferred in am 2* pt to receive feeding tube and in pm 2* RN report of pt being increasingly agitated with attempts to place feeding tube.  Will follow.  Debe Coder PT Acute Rehabilitation Services Pager [phone] Office [phone]    [place] 01/22/2019, 1:53 PM

## 2019-01-22 NOTE — Progress Notes (Signed)
Pt pulled NG x2. Pt and wife refusing additional attempts at this time. Wife states, "I would like to wait and try again tomorrow." Wife asked RN to contact SLP to ask for reassessment, this also suggested by Dr. Elsworth Soho. SLP made aware of situation. Unable to see patient today but pt placed on high-priority to be seen tomorrow 7/25

## 2019-01-23 DIAGNOSIS — R0902 Hypoxemia: Secondary | ICD-10-CM

## 2019-01-23 LAB — GLUCOSE, CAPILLARY
Glucose-Capillary: 135 mg/dL — ABNORMAL HIGH (ref 70–99)
Glucose-Capillary: 137 mg/dL — ABNORMAL HIGH (ref 70–99)
Glucose-Capillary: 138 mg/dL — ABNORMAL HIGH (ref 70–99)
Glucose-Capillary: 138 mg/dL — ABNORMAL HIGH (ref 70–99)
Glucose-Capillary: 143 mg/dL — ABNORMAL HIGH (ref 70–99)
Glucose-Capillary: 143 mg/dL — ABNORMAL HIGH (ref 70–99)
Glucose-Capillary: 159 mg/dL — ABNORMAL HIGH (ref 70–99)
Glucose-Capillary: 167 mg/dL — ABNORMAL HIGH (ref 70–99)
Glucose-Capillary: 180 mg/dL — ABNORMAL HIGH (ref 70–99)

## 2019-01-23 LAB — PROCALCITONIN: Procalcitonin: 1.44 ng/mL

## 2019-01-23 NOTE — Progress Notes (Signed)
Called to room for saturations reading below 88%.  Fio2 increased back to 100% to maintain saturations as ordered.

## 2019-01-23 NOTE — Progress Notes (Signed)
NAME:  Tanner Cox., MRN:  [id number], DOB:  1932/04/04, LOS: 46 ADMISSION DATE:  01/23/2019, CONSULTATION DATE:  01/29/2019 REFERRING MD:  Horris Latino, MD CHIEF COMPLAINT:  Shortness of breath, hemoptysis  Brief History   85 year old remote smoker from [place], was being treated for ILD/IPAF with CellCept and prednisone (Dr. Winona Legato at [facility] ) and Cosentyx for longstanding psoriatic arthritis admitted 7/10 for "ILD flare" course complicated by delirium requiring mechanical ventilation 7/15. He was extubated 7/17 but  had persistent delirium and increased oxygen requirements attributed to aspiration. He was treated with 11 days of antibiotics initially cefepime and then Zosyn , BAL cultures were negative  Past Medical History  COPD, AAA, CAD, Psoriatec arthritis, GERD, nephrolithiasis status post recent stent, wife reports today (7/25) hx of laryngeal CA  Significant Hospital Events   7/10 admitted to [facility] 7/11 Near cardiac arrest, SVT, altered mental status, Seen by [facility] for recommendation on ILD flare, steroids started 7/12 Mild delirium first noted by family 7/13-7/14: Delirium persist 7/15 Overnight increasing O2 requirements, agitation, hypertensive crisis, CTH negative, new hematuria, labile BP 7/16 Bradycardia, precedex d/c'd. FIO2/PEEP decreased. CXR a little worse but may be volume loss from low tidal vol ventilation 7/17: Extubation.  Stopping vancomycin. 7/17-7/20. Agitation remains the primary barrier. Speech better as of 7/20 but still on high flow. On 7/20 transitioning to Haldol over precedex. Added librium  7/21 looking a little better. Adding low dose seroquel and decreasing librium freq. Getting OOB. Steroids transitioned to PO. abx completed 7/22 HR  afib 160's, amio gtt started, febrile blood and urine Cx sent; resuming empiric HCAP coverage  Consults:  PCCM  Procedures:  ETT 7/15 >> 7/17 Central Line 7/15 >> 7/20  Significant Diagnostic Tests:  CTA  C/A/P 7/8: Emphysematous changes with peripheral reticular opacities CT Head 7/15: Negative  Micro Data:  COVID-19 7/10 - neg MRSA by PCR 7/11 - neg BCX 7/10 >> negative UCx 7/10 -negative BAL 7/15 >> negative BCx 7/22 >>neg to date UCx 7/22 >>neg to date  Antimicrobials:  Doxycycline 7/14 >> 7/15 Vanc 7/15 >> 7/17 Cefepime 7/10>> 7/17 Zosyn 7/15 >>7/21 vanc 7/22>> merrem 7/22>>>  Interim history/subjective:  Wife at bedside, seems to keep pt oriented, calm Defervesced Was able to sleep overnight Remains on high flow oxygen 100% / 40 L RNs unable to place NG tube--SLP to see today  Objective   Blood pressure 109/74, pulse 74, temperature 98.9 F (37.2 C), temperature source Axillary, resp. rate 18, height 6' (1.829 m), weight 99.1 kg, SpO2 91 %.    FiO2 (%):  [90 %-100 %] 90 %   Intake/Output Summary (Last 24 hours) at 01/23/2019 0800 Last data filed at 01/23/2019 0700 Gross per 24 hour  Intake 1205.02 ml  Output 1025 ml  Net 180.02 ml   Filed Weights   01/19/19 0500 01/21/19 0500 01/22/19 0455  Weight: 97.3 kg 98.8 kg 99.1 kg    Physical Exam: General: Elderly obese man, ill-appearing HEENT: [place]/AT, hoarse voice, no JVD, MMM CV: irreg. Irregular-->atrial fibrillation on monitor, no murmurs, gallops, or rubs Lungs: clear upper quadrants with few scattered rhonchi; bilateral basal crackles,  Abd: soft, nontender Ext: warm, dry, palpable pulses GU: Dark amber/brown colored urine Neuro: AOx4, follows simple commands, talkative but not linear.   Chest x-ray 7/24 personally reviewed which shows bilateral interstitial infiltrates, some alveolar consolidation left lower lobe   Resolved Hospital Problem list   Hemoptysis, Hypertensive Crisis, Hx of B12 deficiency   Assessment & Plan:  Acute on chronic hypoxemic respiratory failure, ILD exacerbation with small volume hemopytsis- aspiration event vs progression of ILD , doubt pulm edema: Completed antibiotics -  7/21, Pending blood and urine Cx - 7/22 Plan: Continue methylprednisolone 60 every 12 Hold CellCept for now Resume HCAP coverage  Continue to wean [facility] (sat goal >88%), as tolerated Encourage oral suctioning and chest PT PRN Xopenex Not a candidate for BiPAP given delirium Promote out of bed    Paroxysmal Afib with intermittent rapid ventricular response and then also intermittent bradycardia Plan: Continue Cardizem gtt, PRN metoprolol Avoid amiodarone d/t worsening ILD No AC given recent hemoptysis  ICU Delirium/ ETOH withdrawal/ possibly exacerbated by steroids/ consider infectious process or hypoxia: Plan: Continue folic acid, MVI, thiamine PRN haldol for agitation; trying to hold as family wants him lucid and prev experience with librium was 2 days of sedation/delirium Encourage PT mobilization  Protein Calorie Malnutrition Dysphagia-pre-existing vocal cord paralysis Plan:  Aspiration precautions Doubt he will cleared swallow eval  SLP to eval today (7/25); Place small bore NG tube and start tube feeds -if unable may have to consider short term TNA  New fever 7/23 -nosocomial UTI versus HCAP Slight high procalcitonin -Restarted meropenem and vancomycin, await cultures then simplify  L kidney stone s/p stenting, R atrophied kidney CKD, stage III, stable serum creatinine: Plan: Strict I&O's Lasix 40 x 1   Hypernatremia, hyperchloremia, hypokalemia  Plan repleted     Best practice:  Diet: NPO Pain/Anxiety/Delirium protocol (if indicated): N/A VAP protocol (if indicated): N/A DVT prophylaxis: SCDs GI prophylaxis: PPI Glucose control: CBG q4h Mobility: PT/OT Code Status: DNR Family Communication: with wife, who is at beside today Disposition: ICU  Summary-he remains severely hypoxic, he has been treated with antibiotics for aspiration pneumonia and with steroids for ILD flare, care limitations have been set.  Hope is that hypoxia improves over the next 2 to  3 days, we will try to build up his nutrition in the interim, clearly if he worsens we will progress to full comfort care-family agrees with this plan  The patient is critically ill with multiple organ systems failure and requires high complexity decision making for assessment and support, frequent evaluation and titration of therapies, application of advanced monitoring technologies and extensive interpretation of multiple databases. Critical Care Time devoted to patient care services described in this note independent of APP/resident  time is 34 minutes.   Bonna Gains MD, PhD  01/23/2019 8:17 AM

## 2019-01-23 NOTE — Evaluation (Signed)
Physical Therapy Evaluation Patient Details Name: Tanner Cox. MRN: [id number] DOB: 1931/07/08 Today's Date: 01/23/2019   History of Present Illness  85 year old remote smoker from [place], was being treated for ILD/IPAF with CellCept and prednisone (Dr. Winona Legato at [facility] ) and Cosentyx for longstanding psoriatic arthritis admitted 7/10 for "ILD flare" course complicated by delirium requiring mechanical ventilation 7/15.  He was extubated 7/17 but  had persistent delirium and increased oxygen requirements attributed to aspiration.  Clinical Impression  Pt admitted with above diagnosis. Pt currently with functional limitations due to the deficits listed below (see PT Problem List). +2 total assist for bed mobility, pt sat on edge of bed x 8 minutes, +2 max assist sit to stand with RW x 2 trials, pt stood for ~20 seconds each trial. SaO2 86% on 40L HFNC, 100%FiO2 during activity. SNF recommended.  Pt will benefit from skilled PT to increase their independence and safety with mobility to allow discharge to the venue listed below.       Follow Up Recommendations SNF;Supervision for mobility/OOB    Equipment Recommendations  (To be determined at [facility])    Recommendations for Other Services       Precautions / Restrictions Precautions Precautions: Fall Precaution Comments: monitor O2, agitation Restrictions Weight Bearing Restrictions: No      Mobility  Bed Mobility Overal bed mobility: Needs Assistance Bed Mobility: Supine to Sit;Sit to Supine     Supine to sit: +2 for physical assistance;Total assist Sit to supine: Total assist;+2 for physical assistance   General bed mobility comments: pt 20%, assist to raise trunk and advance BLEs; pt sat edge of bed x 8 minutes  Transfers Overall transfer level: Needs assistance Equipment used: Rolling walker (2 wheeled) Transfers: Sit to/from Stand Sit to Stand: +2 physical assistance;Max assist         General transfer  comment: max A of 2 to rise, multimodal cues for hand placement, flexed posture in standing, sit to stand x 2 trials, pt stood for ~20 seconds each; SaO2 dropped to 86% on 40L O2 HFNC with activity  Ambulation/Gait                Stairs            Wheelchair Mobility    Modified Rankin (Stroke Patients Only)       Balance Overall balance assessment: Needs assistance Sitting-balance support: Feet supported Sitting balance-Leahy Scale: Fair Sitting balance - Comments: sat EOB x 8 minutes   Standing balance support: Bilateral upper extremity supported Standing balance-Leahy Scale: Poor Standing balance comment: relies on BUE support                             Pertinent Vitals/Pain Pain Assessment: Faces Pain Score: 2  Faces Pain Scale: No hurt Pain Location: pain with movement Pain Descriptors / Indicators: Moaning Pain Intervention(s): Limited activity within patient's tolerance    Home Living Family/patient expects to be discharged to:: Private residence Living Arrangements: Spouse/significant other Available Help at Discharge: Family;Available 24 hours/day           Home Equipment: Walker - 2 wheels      Prior Function Level of Independence: Independent         Comments: 10 falls in past year, refuses RW per wife     Hand Dominance        Extremity/Trunk Assessment   Upper Extremity Assessment Upper Extremity Assessment: Difficult to assess due  to impaired cognition    Lower Extremity Assessment Lower Extremity Assessment: Difficult to assess due to impaired cognition    Cervical / Trunk Assessment Cervical / Trunk Assessment: Normal  Communication   Communication: Expressive difficulties;HOH(mumbles)  Cognition Arousal/Alertness: Awake/alert Behavior During Therapy: WFL for tasks assessed/performed Overall Cognitive Status: Impaired/Different from baseline Area of Impairment: Orientation;Attention;Memory;Following  commands;Safety/judgement;Problem solving                 Orientation Level: Place;Time;Situation     Following Commands: Follows one step commands inconsistently Safety/Judgement: Decreased awareness of safety;Decreased awareness of deficits   Problem Solving: Slow processing;Decreased initiation;Difficulty sequencing;Requires verbal cues;Requires tactile cues        General Comments      Exercises     Assessment/Plan    PT Assessment Patient needs continued PT services  PT Problem List Decreased mobility;Decreased strength;Decreased activity tolerance;Decreased balance;Decreased cognition       PT Treatment Interventions Gait training;DME instruction;Therapeutic activities;Therapeutic exercise;Functional mobility training;Balance training;Patient/family education    PT Goals (Current goals can be found in the Care Plan section)  Acute Rehab PT Goals PT Goal Formulation: Patient unable to participate in goal setting Time For Goal Achievement: 02/06/19 Potential to Achieve Goals: Fair    Frequency Min 2X/week   Barriers to discharge        Co-evaluation               AM-PAC PT "6 Clicks" Mobility  Outcome Measure Help needed turning from your back to your side while in a flat bed without using bedrails?: Total Help needed moving from lying on your back to sitting on the side of a flat bed without using bedrails?: Total Help needed moving to and from a bed to a chair (including a wheelchair)?: Total Help needed standing up from a chair using your arms (e.g., wheelchair or bedside chair)?: Total Help needed to walk in hospital room?: Total Help needed climbing 3-5 steps with a railing? : Total 6 Click Score: 6    End of Session Equipment Utilized During Treatment: Gait belt;Oxygen Activity Tolerance: Patient limited by fatigue Patient left: in bed;with call bell/phone within reach;with nursing/sitter in room Nurse Communication: Mobility status;Need  for lift equipment PT Visit Diagnosis: Unsteadiness on feet (R26.81);Muscle weakness (generalized) (M62.81);Difficulty in walking, not elsewhere classified (R26.2);History of falling (Z91.81)    Time: [id number] PT Time Calculation (min) (ACUTE ONLY): 22 min   Charges:   PT Evaluation $PT Eval Moderate Complexity: 1 Mod          Tamala SerUhlenberg, Dewayne Severe Kistler PT 01/23/2019  Acute Rehabilitation Services Pager [phone] Office [phone]

## 2019-01-23 NOTE — Progress Notes (Signed)
  Speech Language Pathology Treatment: Dysphagia  Patient Details Name: Tanner Cox. MRN: [id number] DOB: [birth year] Today's Date: 01/23/2019 Time: 4010-2725 SLP Time Calculation (min) (ACUTE ONLY): 40 min  Assessment / Plan / Recommendation Clinical Impression  Patient's wife is at bedside and expresses she wants him to have something to eat. Patient was awake and confused. He had hallucinations intermittently during treatment. His wife reported he was having these prior to hospitalization. Oral care was provided. PO trials included ice chips, water, applesauce, cracker all of which were tolerated with no s/sx of aspiration. Patient had a productive cough only two time during entire session that seemed to be unlikely related to PO. His voice is hoarse due to h/o laryngeal cancer (15 years prior). No changes in voice with PO. Wife reports pt was able to eat any consistencies prior to hospitalization, however he preferred milkshakes and softer foods. Suspect they were easier for him to manage with his breathing changes and coordination of chewing/swallow/breath. Recommend a Dys 2, thin liquid diet, medications administered whole in puree. Full assistance provided to patient for all intake to ensure small bites/sips and general aspiration precautions. Discussed all these finding and recommendations with pt's wife and RN. ST to follow with diet tolerance and further education. Pt remains a risk for aspiration due to his respiratory conditions, following the above strategies will reduce his risk for aspiration.    HPI HPI: pt is an 85 yo male adm to [facility] with AMS, required intubation from 7/15-7/17/2020.  He has needed HFNC warmed and on/off Bipap since extubation.  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 Swallow evaluation ordered.  His code status is partial - Bipap/intubation only no CPR.  Swallow evaluation ordered.  Pt has  been confused and agitated during hospital stay.      SLP Plan  Continue with current plan of care       Recommendations  Diet recommendations: Dysphagia 2 (fine chop);Thin liquid Liquids provided via: Straw Medication Administration: Whole meds with puree Supervision: Full supervision/cueing for compensatory strategies Compensations: Minimize environmental distractions;Slow rate;Small sips/bites Postural Changes and/or Swallow Maneuvers: Seated upright 90 degrees                Oral Care Recommendations: Oral care QID Follow up Recommendations: Skilled Nursing facility SLP Visit Diagnosis: Dysphagia, oropharyngeal phase (R13.12) Plan: Continue with current plan of care       [place], MA, CCC-SLP 01/23/2019 9:29 AM

## 2019-01-24 ENCOUNTER — Inpatient Hospital Stay (HOSPITAL_COMMUNITY): Payer: Medicare Other

## 2019-01-24 LAB — BASIC METABOLIC PANEL
Anion gap: 11 (ref 5–15)
BUN: 58 mg/dL — ABNORMAL HIGH (ref 8–23)
CO2: 23 mmol/L (ref 22–32)
Calcium: 9.1 mg/dL (ref 8.9–10.3)
Chloride: 114 mmol/L — ABNORMAL HIGH (ref 98–111)
Creatinine, Ser: 1.88 mg/dL — ABNORMAL HIGH (ref 0.61–1.24)
GFR calc Af Amer: 37 mL/min — ABNORMAL LOW (ref >60)
GFR calc non Af Amer: 32 mL/min — ABNORMAL LOW (ref >60)
Glucose, Bld: 164 mg/dL — ABNORMAL HIGH (ref 70–99)
Potassium: 4 mmol/L (ref 3.5–5.1)
Sodium: 148 mmol/L — ABNORMAL HIGH (ref 135–145)

## 2019-01-24 LAB — CBC WITH DIFFERENTIAL/PLATELET
Abs Immature Granulocytes: 0.06 10*3/uL (ref 0.00–0.07)
Basophils Absolute: 0 10*3/uL (ref 0.0–0.1)
Basophils Relative: 0 %
Eosinophils Absolute: 0 10*3/uL (ref 0.0–0.5)
Eosinophils Relative: 0 %
HCT: 28.7 % — ABNORMAL LOW (ref 39.0–52.0)
Hemoglobin: 8.5 g/dL — ABNORMAL LOW (ref 13.0–17.0)
Immature Granulocytes: 1 %
Lymphocytes Relative: 3 %
Lymphs Abs: 0.2 10*3/uL — ABNORMAL LOW (ref 0.7–4.0)
MCH: 29.5 pg (ref 26.0–34.0)
MCHC: 29.6 g/dL — ABNORMAL LOW (ref 30.0–36.0)
MCV: 99.7 fL (ref 80.0–100.0)
Monocytes Absolute: 0.2 10*3/uL (ref 0.1–1.0)
Monocytes Relative: 3 %
Neutro Abs: 8.8 10*3/uL — ABNORMAL HIGH (ref 1.7–7.7)
Neutrophils Relative %: 93 %
Platelets: 258 10*3/uL (ref 150–400)
RBC: 2.88 MIL/uL — ABNORMAL LOW (ref 4.22–5.81)
RDW: 14.8 % (ref 11.5–15.5)
WBC: 9.4 10*3/uL (ref 4.0–10.5)
nRBC: 0 % (ref 0.0–0.2)

## 2019-01-24 LAB — URINE CULTURE: Culture: NO GROWTH

## 2019-01-24 LAB — GLUCOSE, CAPILLARY
Glucose-Capillary: 150 mg/dL — ABNORMAL HIGH (ref 70–99)
Glucose-Capillary: 152 mg/dL — ABNORMAL HIGH (ref 70–99)
Glucose-Capillary: 155 mg/dL — ABNORMAL HIGH (ref 70–99)
Glucose-Capillary: 145 mg/dL — ABNORMAL HIGH (ref 70–99)
Glucose-Capillary: 167 mg/dL — ABNORMAL HIGH (ref 70–99)
Glucose-Capillary: 142 mg/dL — ABNORMAL HIGH (ref 70–99)

## 2019-01-24 LAB — MAGNESIUM: Magnesium: 2.8 mg/dL — ABNORMAL HIGH (ref 1.7–2.4)

## 2019-01-24 MED ORDER — FUROSEMIDE 10 MG/ML IJ SOLN
20.0000 mg | Freq: Once | INTRAMUSCULAR | Status: AC
  Administered 2019-01-24: 20 mg via INTRAVENOUS
  Filled 2019-01-24: qty 2

## 2019-01-24 NOTE — Progress Notes (Signed)
FAMILY MEETING:  Over the course of the first several hours of the day, Mr. Housman oxygen requirement increased, now on near maximal high flow nasal cannula.  His mental status has become much more fragile.  Based on his clinical picture, I spoke to the wife and asked her to have a family meeting today.  With 2 daughters present, and a third 1 on the phone, we discussed his clinical picture, and likely terminal outcome.  To the family about trying to find some common guidelines for instituting comfort care measures.  They recognize that he is likely terminal, but are not ready to institute full comfort care measures yet.  After some discussion amongst themselves they asked if we could try some Haldol or other things to make him "more comfortable".  I cautioned him that any change in his level of consciousness may decrease his respiratory drive, but strive for measures short of full comfort care measures or morphine drips, etc. equally strived let him be involved in his own decision making.  Contacted the hospital administration about some visitation policies, as they are clear about full comfort care measures and allowing families to visit, but equally unclear about these more gray zone or transitional circumstances.  Today, per the [facility] the family will be allowed to visit.  The issues being addressed with higher levels of administration to see if we can let the family still come and visit over the next few days while his clinical picture likely becomes more profound.  Bonna Gains 1:34 PM 01/24/19

## 2019-01-24 NOTE — Progress Notes (Signed)
NAME:  Tanner Cox., MRN:  [id number], DOB:  04/28/1932, LOS: 21 ADMISSION DATE:  01/07/2019, CONSULTATION DATE:  12/31/2018 REFERRING MD:  Horris Latino, MD CHIEF COMPLAINT:  Shortness of breath, hemoptysis  Brief History   85 year old remote smoker from [place], was being treated for ILD/IPAF with CellCept and prednisone (Dr. Winona Legato at [facility] ) and Cosentyx for longstanding psoriatic arthritis admitted 7/10 for "ILD flare" course complicated by delirium requiring mechanical ventilation 7/15. He was extubated 7/17 but  had persistent delirium and increased oxygen requirements attributed to aspiration. He was treated with 11 days of antibiotics initially cefepime and then Zosyn , BAL cultures were negative  Past Medical History  COPD, AAA, CAD, Psoriatec arthritis, GERD, nephrolithiasis status post recent stent, wife reports today (7/25) hx of laryngeal CA  Significant Hospital Events   7/10 admitted to [facility] 7/11 Near cardiac arrest, SVT, altered mental status, Seen by [facility] for recommendation on ILD flare, steroids started 7/12 Mild delirium first noted by family 7/13-7/14: Delirium persist 7/15 Overnight increasing O2 requirements, agitation, hypertensive crisis, CTH negative, new hematuria, labile BP 7/16 Bradycardia, precedex d/c'd. FIO2/PEEP decreased. CXR a little worse but may be volume loss from low tidal vol ventilation 7/17: Extubation.  Stopping vancomycin. 7/17-7/20. Agitation remains the primary barrier. Speech better as of 7/20 but still on high flow. On 7/20 transitioning to Haldol over precedex. Added librium  7/21 looking a little better. Adding low dose seroquel and decreasing librium freq. Getting OOB. Steroids transitioned to PO. abx completed 7/22 HR  afib 160's, amio gtt started, febrile blood and urine Cx sent; resuming empiric HCAP coverage  Consults:  PCCM  Procedures:  ETT 7/15 >> 7/17 Central Line 7/15 >> 7/20  Significant Diagnostic Tests:  CTA  C/A/P 7/8: Emphysematous changes with peripheral reticular opacities CT Head 7/15: Negative  CXR 7/26-personally reviewed Agree with IMPRESSION: 1. Overall, there is slightly improved aeration, particularly in the right mid to lower lung. Otherwise, the radiographic appearance the chest is essentially unchanged, with what appears to be a background of mild interstitial lung disease with superimposed multilobar pneumonia and moderate left parapneumonic pleural effusion.  Micro Data:  [id number] 7/10 - neg MRSA by PCR 7/11 - neg BCX 7/10 >> negative UCx 7/10 -negative BAL 7/15 >> negative BCx 7/22 >>neg to date UCx 7/22 >>neg to date  Antimicrobials:  Doxycycline 7/14 >> 7/15 Vanc 7/15 >> 7/17 Cefepime 7/10>> 7/17 Zosyn 7/15 >>7/21 vanc 7/22>> merrem 7/22>>>  Interim history/subjective:  Wife at bedside, seems to keep pt oriented, calm Defervesced Was able to sleep overnight Remains on high flow oxygen 100% / 40 L; episodes of pulling O2 off yesterday with rapid desats.  Objective   Blood pressure (!) 165/136, pulse (!) 110, temperature 98.9 F (37.2 C), temperature source Axillary, resp. rate (!) 21, height 6' (1.829 m), weight 97.9 kg, SpO2 92 %.    FiO2 (%):  [100 %] 100 %   Intake/Output Summary (Last 24 hours) at 01/24/2019 0747 Last data filed at 01/24/2019 0600 Gross per 24 hour  Intake 1383.14 ml  Output 825 ml  Net 558.14 ml   Filed Weights   01/21/19 0500 01/22/19 0455 01/24/19 0210  Weight: 98.8 kg 99.1 kg 97.9 kg    Physical Exam: General: Elderly obese man, ill-appearing HEENT: [place]/AT, hoarse voice, no JVD, MMM CV: irreg. Irregular-->atrial fibrillation on monitor, no murmurs, gallops, or rubs Lungs: clear upper quadrants with few scattered rhonchi; bilateral basal crackles, decreased BS,  Abd: soft, nontender Ext: warm,  dry, palpable pulses GU: Dark amber/brown colored urine Neuro: A/O with spouse present, follows simple commands, talkative but not  linear.   Chest x-ray 7/24 personally reviewed which shows bilateral interstitial infiltrates, some alveolar consolidation left lower lobe   Resolved Hospital Problem list   Hemoptysis, Hypertensive Crisis, Hx of B12 deficiency   Assessment & Plan:   Acute on chronic hypoxemic respiratory failure, ILD exacerbation with small volume hemopytsis- aspiration event vs progression of ILD , doubt pulm edema: Completed antibiotics - 7/21, Pending blood and urine Cx - 7/22 (neg x 72 hours) Plan: Continue methylprednisolone 60 every 12 Hold CellCept for now Resume HCAP coverage  Continue to wean [facility] (sat goal >88%), as tolerated Encourage oral suctioning and chest PT PRN Xopenex Not a candidate for BiPAP given delirium Promote out of bed    Paroxysmal Afib with intermittent rapid ventricular response and then also intermittent bradycardia Plan: Continue Cardizem gtt, PRN metoprolol; transition to PO today and see if can wean gtt. Trial lasix  Avoid amiodarone d/t worsening ILD No AC given recent hemoptysis  ICU Delirium/ ETOH withdrawal/ possibly exacerbated by steroids/ consider infectious process or hypoxia: Plan: Continue folic acid, MVI, thiamine PRN haldol for agitation; trying to hold as family wants him lucid and prev experience with librium was 2 days of sedation/delirium Encourage PT mobilization  Protein Calorie Malnutrition Dysphagia-pre-existing vocal cord paralysis Plan:  Aspiration precautions SLP eval (7/25); dysphagia 2 diet, which he seemed to tolerate on start date  New fever 7/23 -nosocomial UTI versus HCAP Slight high procalcitonin -Restarted meropenem and vancomycin, await cultures then simplify  L kidney stone s/p stenting, R atrophied kidney CKD, stage III, stable serum creatinine: Plan: Strict I&O's Lasix 40 x 1   Hypernatremia, hyperchloremia, hypokalemia  Plan repleted     Best practice:  Diet: Dysphagia 2 Pain/Anxiety/Delirium protocol  (if indicated): N/A VAP protocol (if indicated): N/A DVT prophylaxis: SCDs GI prophylaxis: PPI Glucose control: CBG q4h Mobility: PT/OT Code Status: DNR Family Communication: with wife, who is at beside today Disposition: ICU; SNF recc if can wean O2, which is starting to appear unlikely  Summary-he remains severely hypoxic, he has been treated with antibiotics for aspiration pneumonia and with steroids for ILD flare, care limitations have been set.  Hope is that hypoxia improves over the next 2 to 3 days, we will try to build up his nutrition in the interim, clearly if he worsens we will progress to full comfort care-family agrees with this plan. Will consult palliative team today for family support, expectation management if not improving   The patient is critically ill with multiple organ systems failure and requires high complexity decision making for assessment and support, frequent evaluation and titration of therapies, application of advanced monitoring technologies and extensive interpretation of multiple databases. Critical Care Time devoted to patient care services described in this note independent of APP/resident  time is 32 minutes.   Bonna Gains MD, PhD  01/24/2019 7:56 AM

## 2019-01-25 DIAGNOSIS — Z7189 Other specified counseling: Secondary | ICD-10-CM

## 2019-01-25 DIAGNOSIS — J84112 Idiopathic pulmonary fibrosis: Secondary | ICD-10-CM

## 2019-01-25 LAB — GLUCOSE, CAPILLARY
Glucose-Capillary: 150 mg/dL — ABNORMAL HIGH (ref 70–99)
Glucose-Capillary: 155 mg/dL — ABNORMAL HIGH (ref 70–99)
Glucose-Capillary: 155 mg/dL — ABNORMAL HIGH (ref 70–99)
Glucose-Capillary: 131 mg/dL — ABNORMAL HIGH (ref 70–99)

## 2019-01-25 LAB — CULTURE, BLOOD (ROUTINE X 2)
Culture: NO GROWTH
Culture: NO GROWTH
Special Requests: ADEQUATE
Special Requests: ADEQUATE

## 2019-01-25 LAB — PHOSPHORUS: Phosphorus: 3.4 mg/dL (ref 2.5–4.6)

## 2019-01-25 LAB — MAGNESIUM: Magnesium: 2.7 mg/dL — ABNORMAL HIGH (ref 1.7–2.4)

## 2019-01-25 MED ORDER — FUROSEMIDE 10 MG/ML IJ SOLN
40.0000 mg | Freq: Three times a day (TID) | INTRAMUSCULAR | Status: AC
  Administered 2019-01-25 (×2): 40 mg via INTRAVENOUS
  Filled 2019-01-25 (×2): qty 4

## 2019-01-25 NOTE — Progress Notes (Addendum)
SLP Cancellation Note  Patient Details Name: Tanner Cox. MRN: [id number] DOB: 1932/03/16   Cancelled treatment:       Reason Eval/Treat Not Completed: Other (comment)(per notes from RT, pt agitated and has desaturation, Recommend consideration to re-address goals of care as this pt will continually aspirate given his h/o laryngeal cancer s/p radiation, XRT likely resulting in fibrosis that negatively impacts sensorimotor components of swallowing.    Recurrent/ongoing aspiration of even secretions increases his risk of recurrent acute on chronic respiratory deficits/pneumonias.      Luanna Salk, MS [facility] SLP Acute Rehab Services Pager [phone] Office [phone]  Macario Golds 01/25/2019, 2:06 PM

## 2019-01-25 NOTE — Progress Notes (Signed)
Notified by Erlene Senters, both daughters of patient, in regards to visitation and the visitation policy. Patient's wife is allowed to be present due to patient trying to get out of bed on frequent occasions. Both daughters are very upset not being able to come to visit and or the inconsistencies with visitation. Daughters do want to visit patient. Patient is currently awake and following some commands, voice is raspy. Patient seems more awake today. Patient does not appear to be actively passing away. Patient can be agitated and irritable at times. Safety sitter at beside. Based on the current visitation policy, due to patient being confused and safety concern to self, 1 support person can be at beside. Before speaking with both daughters, spoke with CCM NP Velna Hatchet and Tama Headings., Education administrator, in regards to situation. With patient's current status and the wife at bedside, will not allow other visitation at this time. Explained the patient's current situation with daughters and the current visitation policy. Also apologized for the inconsistencies and discussed why those inconsistencies could have occurred due to patient's changing clinical status. Then reiterated why we currently have these visitation guidelines in place because of the Covid-19 Pandemic. These visitation guidelines are in place to keep our patients and our staff safe. Discussed with both daughters and wife if patient's clinical status changes will have nursing staff notify them and revisit visitation.

## 2019-01-25 NOTE — Progress Notes (Signed)
[facility] Progress Note Patient Name: Tanner Cox. DOB: [birth year] MRN: [id number]   Date of Service  01/25/2019  HPI/Events of Note  Notified of non sustained VT. Previously on amiodarone but discontinued 4 days ago  eICU Interventions   Draw labs earlier to include Mg and Phos. Patient did receive a dose of furosemide yesterday.  Orders for amiod and tube feeding discontinued as per bedside RN request since these have not been given for the past few days     Intervention Category Major Interventions: Arrhythmia - evaluation and management  Shona Needles Janavia Rottman 01/25/2019, 12:12 AM

## 2019-01-25 NOTE — Progress Notes (Addendum)
NAME:  Tanner Cox., MRN:  [id number], DOB:  May 03, 1932, LOS: 36 ADMISSION DATE:  01/20/2019, CONSULTATION DATE:  01/18/2019 REFERRING MD:  Horris Latino, MD CHIEF COMPLAINT:  Shortness of breath, hemoptysis  Brief History   85 y/o M, remote smoker, from [place], was being treated for ILD/IPAF with CellCept and prednisone (Dr. Winona Legato at [facility] ) and Cosentyx for longstanding psoriatic arthritis admitted 7/10 for suspected "ILD flare".  Course complicated by delirium requiring mechanical ventilation 7/15. He was extubated 7/17 but had persistent delirium and increased oxygen requirements attributed to aspiration. He was treated with 11 days of antibiotics initially cefepime and then zosyn, BAL negative.    Past Medical History  COPD, AAA, CAD, Psoriatec arthritis, GERD, nephrolithiasis status post recent stent, wife reports today (7/25) hx of laryngeal CA  Significant Hospital Events   7/10 Admitted to [facility] 7/11 Near cardiac arrest, SVT, AMS. PCCM eval for ILD flare, steroids started 7/12 Mild delirium first noted by family 7/14 Delirium persists 7/15 Increasing O2 need, agitation, hypertensive crisis, CTH negative, new hematuria, labile BP 7/16 Bradycardia, precedex d/c'd. FIO2/PEEP decreased. CXR a little worse  7/17 Extubation. Stopped vancomycin. 7/19 Agitation remains the primary barrier to extubation.  7/20 Speech improved, still on high flow. Tx to Haldol over precedex. Added librium  7/21 Adding low dose seroquel, decreasing librium freq. OOB. Steroids transitioned to PO.   7/22 AF 160's, amio gtt started, febrile blood and urine Cx sent; resuming empiric HCAP coverage 7/26 Wife at bedside, seems to keep pt oriented, calm. HFNC 100%/40L.  Consults:  PCCM  Procedures:  ETT 7/15 >> 7/17 Central Line 7/15 >> 7/20  Significant Diagnostic Tests:  CTA C/A/P 7/8 >> Emphysematous changes with peripheral reticular opacities CT Head 7/15 >> negative  Micro Data:  COVID-19  7/10 >> neg MRSA by PCR 7/11>> neg BCX 7/10 >> negative UCx 7/10 >> negative BAL 7/15 >> negative BCx 7/22 >> negative UCx 7/22 >> negative  Antimicrobials:  Doxycycline 7/14 >> 7/15 Vanc 7/15 >> 7/17 Cefepime 7/10>> 7/17 Zosyn 7/15 >>7/21 Vanc 7/22 >> 7/27 Merrem 7/22 >> 7/27  Interim history/subjective:  Wife at bedside.  RN reports pt requiring intermittent haldol for agitation.  100% FiO2/60L flow HFNC.  Afebrile.    Objective   Blood pressure (!) 152/84, pulse (!) 124, temperature 98.7 F (37.1 C), temperature source Axillary, resp. rate (!) 28, height 6' (1.829 m), weight 98.3 kg, SpO2 (!) 80 %.    FiO2 (%):  [80 %-100 %] 100 %   Intake/Output Summary (Last 24 hours) at 01/25/2019 0802 Last data filed at 01/25/2019 0600 Gross per 24 hour  Intake 1259.88 ml  Output -  Net 1259.88 ml   Filed Weights   01/22/19 0455 01/24/19 0210 01/25/19 0438  Weight: 99.1 kg 97.9 kg 98.3 kg    Physical Exam: General: ill appearing elderly male sitting up in bed in NAD  HEENT: MM pink/dry, no jvd, HFNC O2 Neuro: startles when not disturbed, awakens / interacts and drifts back to sleep, MAE, generalized weakness CV: s1s2 irr irr, AF 80's, no m/r/g PULM: even/non-labored, anterior clear, lower lateral diminished GI: soft, non-tender, bsx4 active  Extremities: warm/dry, no edema  Skin: no rashes or lesions  Resolved Hospital Problem list   Hemoptysis, Hypertensive Crisis, Hx of B12 deficiency   Assessment & Plan:   Acute on chronic hypoxemic respiratory failure, ILD exacerbation with small volume hemopytsis- aspiration event vs progression of ILD, doubt pulm edema.  Has been treated with prolonged  course abx without significant change in imaging & culture negative.  P: Continue solumedrol 60 mg IV Q12 D6/7 abx.  Stop date added for 7/28.   Hold home CellCept Consider diuresis in am 7/28 pending sr cr review Wean FiO2 for saturations >88% Push pulmonary hygiene- IS, mobilize  PRN xopenex  No BiPAP given delirium    Paroxysmal AF  -intermittent rapid ventricular response and then also intermittent bradycardia P: Continue cardizem gtt PRN metoprolol  Avoid amiodarone with ILD  No anticoagulation given recent hemoptysis   ICU Delirium ETOH withdrawal -possibly exacerbated by steroids/ consider infectious process or hypoxia P: MVI, folic acid, thiamine  PRN haldol for agitation, avoid sedating medications as able  PT / mobilization  Promote sleep / wake cycle  Protein Calorie Malnutrition Dysphagia-pre-existing vocal cord paralysis P:  Aspiration precautions  SLP evaluation appreciated   Fever 7/23  -nosocomial UTI versus HCAP P: Fever curve improved  ABX stop date added for 7/28  L kidney stone s/p stenting, 60% Renal Artery Stenosis on L R atrophied kidney CKD, stage III  Hypernatremia, hyperchloremia, hypokalemia  P: Trend BMP / urinary output Replace electrolytes as indicated Avoid nephrotoxic agents, ensure adequate renal perfusion  Best practice:  Diet: Dysphagia 2 Pain/Anxiety/Delirium protocol (if indicated): PRN Haldol  VAP protocol (if indicated): N/A DVT prophylaxis: SCDs GI prophylaxis: PPI Glucose control: CBG q4h Mobility: PT/OT Code Status: DNR Family Communication: Wife at bedside, updated on plan of care.  Disposition: SDU; SNF rec if can wean O2, may be unlikely to wean.    Noe Gens, NP-C [place] [facility] Pgr: [phone] or if no answer [phone] 01/25/2019, 8:22 AM  Attending Note:  85 year old male with extensive smoking history and IPF who presents with respiratory failure.  Patient is now extubated.  Continues to require HFNC with 100% FiO2 with high risk of worsening failure.  On exam, arousable but confused with coarse BS diffusely.  I am continue to be concerned about his ability to leave the hospital alive.  Will continue steroids and HFNC.  Active diureses.  Spoke with wife at length  bedside.  Will plan family meeting in AM.  If continues to be under current condition will discuss transition to comfort which the wife is open to.  PCCM will continue to manage.  The patient is critically ill with multiple organ systems failure and requires high complexity decision making for assessment and support, frequent evaluation and titration of therapies, application of advanced monitoring technologies and extensive interpretation of multiple databases.   Critical Care Time devoted to patient care services described in this note is  34  Minutes. This time reflects time of care of this signee Dr Jennet Maduro. This critical care time does not reflect procedure time, or teaching time or supervisory time of PA/NP/Med student/Med Resident etc but could involve care discussion time.  Rush Farmer, M.D. [facility] Pulmonary/Critical Care Medicine. Pager: [phone]. After hours pager: [phone].

## 2019-01-25 NOTE — Progress Notes (Signed)
RT did not complete CPT due to PT agitation and poor Sp02 at this time. RN aware and in agreement.

## 2019-01-25 NOTE — Progress Notes (Signed)
Pt continues to ask for water. Pt was fully alert, upright, and was only given small sips, as recommended by speech. Pt began to cough and had a gurgling sound in the back of his throat. His O2 sats dropped to 74%. I explained to his wife that he was not swallowing as well as he was on 7/25 and that there was an increased risk for aspiration, demonstrated by his cough and desaturation. Wife continues to give him water despite the education. I will continue to monitor and educate

## 2019-01-25 NOTE — Progress Notes (Signed)
Pharmacy Antibiotic Note  Tanner Cox. is a 85 y.o. male with PMH pulmonary fibrosis on PTA steroids, admitted on 01/27/2019 for respiratory failure d/t ILD exacerbation. Initially treated with 2d vanc/5d Zosyn to r/o aspiration (completed 7/20) but now with new fevers to 102.9 overnight 7/22 Pharmacy has been consulted for meropenem and vancomycin dosing. SCr unchanged since previous vancomycin.  Today, 01/25/2019: Day #5 vanc/meropenem  SCr 1.88 7/26, stable  WBC down to WNL, on steroids  No further fevers/hypothermic  7/26 CXR sl improved aeration, ILD w/ superimposed multilobar PNA  MRSA PCR ordered 7/26 was dc'd (was neg 7/8 and 7/11)  Plan:  vancomycin to 1000 mg IV q24 hr (est AUC 519 based on SCr 1.93; using Vd 0.65 d/t borderline BMI)  Measure vancomycin AUC at steady state as indicated  Continue meropenem 1g IV q12 hr  SCr q48 hr while on vanc  ? Recheck MRSA PCR and DC vanc if negative  Height: 6' (182.9 cm) Weight: 216 lb 11.4 oz (98.3 kg) IBW/kg (Calculated) : 77.6  Temp (24hrs), Avg:98.2 F (36.8 C), Min:97.3 F (36.3 C), Max:98.7 F (37.1 C)  Recent Labs  Lab 01/19/19 0236 01/19/19 1140 01/20/19 0939 01/21/19 1034 01/22/19 0609 01/24/19 0428  WBC 13.8*  --  11.1* 12.9* 11.1* 9.4  CREATININE  --  1.67* 1.60* 1.99* 1.93* 1.88*    Estimated Creatinine Clearance: 34.9 mL/min (A) (by C-G formula based on SCr of 1.88 mg/dL (H)).    Allergies  Allergen Reactions  . Oxycodone-Acetaminophen Nausea And Vomiting  . Sulfa Antibiotics Other (See Comments)    Per wife, pt has a hypersensitivity  . Codeine Rash  . Escitalopram Hives and Rash  . Sulfamethoxazole-Trimethoprim Nausea Only   Antimicrobials this admission: 7/10 cefepime >> 7/11 7/8 ancef x 1 7/11 doxy >> missed all doses 7/12 & 7/13>> 7/15 7/11 azith x 1 7/15 Vanc>> 7/17; resume 7/23>> 7/15 zosyn >> 7/20 7/23 meropenem >>  Dose adjustments this admission: Microbiology results: 7/8  MRSA PCR neg 7/11 MRSA PCR neg 7/11 UCx NGF 7/10 BCx2: ngF 7/15 resp cx BAL: normal flora F 7/15 AFB: neg; Cx pending 7/15 BAL fungus cx: sent, stain neg 7/22 BCx: ngF 7/23 UCx: ngF  Thank you for allowing pharmacy to be a part of this patient's care.  Eudelia Bunch, Pharm.D [phone] 01/25/2019 10:02 AM

## 2019-01-26 ENCOUNTER — Inpatient Hospital Stay (HOSPITAL_COMMUNITY): Payer: Medicare Other

## 2019-01-26 DIAGNOSIS — R410 Disorientation, unspecified: Secondary | ICD-10-CM

## 2019-01-26 LAB — BASIC METABOLIC PANEL
Anion gap: 9 (ref 5–15)
BUN: 66 mg/dL — ABNORMAL HIGH (ref 8–23)
CO2: 27 mmol/L (ref 22–32)
Calcium: 8.7 mg/dL — ABNORMAL LOW (ref 8.9–10.3)
Chloride: 109 mmol/L (ref 98–111)
Creatinine, Ser: 2.17 mg/dL — ABNORMAL HIGH (ref 0.61–1.24)
GFR calc Af Amer: 31 mL/min — ABNORMAL LOW (ref >60)
GFR calc non Af Amer: 27 mL/min — ABNORMAL LOW (ref >60)
Glucose, Bld: 168 mg/dL — ABNORMAL HIGH (ref 70–99)
Potassium: 3.7 mmol/L (ref 3.5–5.1)
Sodium: 145 mmol/L (ref 135–145)

## 2019-01-26 LAB — CBC
HCT: 27.6 % — ABNORMAL LOW (ref 39.0–52.0)
Hemoglobin: 8.7 g/dL — ABNORMAL LOW (ref 13.0–17.0)
MCH: 31.3 pg (ref 26.0–34.0)
MCHC: 31.5 g/dL (ref 30.0–36.0)
MCV: 99.3 fL (ref 80.0–100.0)
Platelets: 239 10*3/uL (ref 150–400)
RBC: 2.78 MIL/uL — ABNORMAL LOW (ref 4.22–5.81)
RDW: 14.7 % (ref 11.5–15.5)
WBC: 13.3 10*3/uL — ABNORMAL HIGH (ref 4.0–10.5)
nRBC: 0.2 % (ref 0.0–0.2)

## 2019-01-26 LAB — HEPATIC FUNCTION PANEL
ALT: 58 U/L — ABNORMAL HIGH (ref 0–44)
AST: 57 U/L — ABNORMAL HIGH (ref 15–41)
Albumin: 2.9 g/dL — ABNORMAL LOW (ref 3.5–5.0)
Alkaline Phosphatase: 44 U/L (ref 38–126)
Bilirubin, Direct: 0.3 mg/dL — ABNORMAL HIGH (ref 0.0–0.2)
Indirect Bilirubin: 0.8 mg/dL (ref 0.3–0.9)
Total Bilirubin: 1.1 mg/dL (ref 0.3–1.2)
Total Protein: 5.9 g/dL — ABNORMAL LOW (ref 6.5–8.1)

## 2019-01-26 LAB — MAGNESIUM: Magnesium: 2.8 mg/dL — ABNORMAL HIGH (ref 1.7–2.4)

## 2019-01-26 LAB — PHOSPHORUS: Phosphorus: 4.2 mg/dL (ref 2.5–4.6)

## 2019-01-26 LAB — GLUCOSE, CAPILLARY: Glucose-Capillary: 144 mg/dL — ABNORMAL HIGH (ref 70–99)

## 2019-01-26 LAB — AMMONIA: Ammonia: 15 umol/L (ref 9–35)

## 2019-01-26 MED ORDER — RISPERIDONE 0.5 MG PO TBDP
0.5000 mg | ORAL_TABLET | Freq: Every day | ORAL | Status: DC
  Administered 2019-01-26: 10:00:00 0.5 mg via ORAL
  Filled 2019-01-26 (×3): qty 1

## 2019-01-26 MED ORDER — MORPHINE SULFATE (PF) 2 MG/ML IV SOLN
0.5000 mg | INTRAVENOUS | Status: DC | PRN
  Administered 2019-01-26: 1.5 mg via INTRAVENOUS
  Administered 2019-01-26: 17:00:00 2 mg via INTRAVENOUS
  Administered 2019-01-26: 0.5 mg via INTRAVENOUS
  Administered 2019-01-26 – 2019-01-27 (×3): 2 mg via INTRAVENOUS
  Filled 2019-01-26 (×6): qty 1

## 2019-01-26 NOTE — Plan of Care (Signed)
  Interdisciplinary Goals of Care Family Meeting   Date carried out:: 01/26/2019  Location of the meeting: Conference room  Member's involved: Nurse Practitioner, Bedside Registered Nurse and Family Member or next of kin - Wife Juliann Pulse), daughter Darrick Penna in person and daughters Darrick Penna / Sonia Baller via phone.   Durable Power of Attorney or acting medical decision maker: Wife - Juliann Pulse in concordance with daughters.    Discussion: We discussed goals of care for [employer].  Family and providers met in conference room to discuss the plan of care for Mr. Goley.  We reviewed his current reason for admission, details of illness, treatment thus far and patients lack of progress.  He continues to require substantial oxygen and we are unable to wean without desaturation.  He continues on full therapy for possible ILD flare.  The family understand that we have reached a point where he is not improving and there is little else medically that we can offer short of comfort measures / treating his symptoms.  We reviewed the use of morphine for shortness of breath / pain and potential side effects of sedation.  We discussed a potential time line for the patient and what his transition might look like.  The family is in agreement to transition to treating his symptoms rather than focusing on curative measures.  They would like for his daughter Curt Bears to visit before any sedating medications are given due to her epilepsy and risk of stress induced seizures.  Will continue to support the family and patient through the transition.      Code status: Full DNR  Disposition: In-patient comfort care.  PRN morphine for comfort.  Titrate for normal respiratory rate / effort.    Time spent for the meeting: 40 minutes    Noe Gens, NP-C [place] [facility] Pgr: [phone] or if no answer [phone] 01/26/2019, 11:33 AM

## 2019-01-26 NOTE — Progress Notes (Signed)
Spoke with wife in detail in conference room regarding patients status.  Concern with possible aspiration event overnight / coughing episode and desaturation to 70's.  He states he is in pain in his low back but family has been concerned about medicating patient due to side effects of sedation.  We reviewed current goals of care and what the transition to comfort care would look like.  She would like to have further conversation with daughters present to review plan of care.  Meeting arranged with administration.  Will follow up with family.   Tanner Gens, NP-C [place] [facility] Pgr: [phone] or if no answer [phone] 01/26/2019, 10:25 AM

## 2019-01-26 NOTE — Progress Notes (Signed)
Nutrition Follow-up  DOCUMENTATION CODES:   Obesity unspecified  INTERVENTION:  - will monitor POC/GOC moving forward.    NUTRITION DIAGNOSIS:   Inadequate oral intake related to inability to eat as evidenced by NPO status. -ongoing  GOAL:   Patient will meet greater than or equal to 90% of their needs -unable to meet   MONITOR:   Labs, Weight trends, I & O's, Other (Comment)(plan of care/goals of care)  ASSESSMENT:   85 year old male with multiple comorbidities including history of HTN, COPD with pulmonary fibrosis, chronic hypoxic failure on 3-4 L of O2 at home, AAA, CAD, nephrolithiasis, recent left ureteric stent placement and planned staged procedure by urology, psoriatic arthritis, GERD. He presented to the ED with complaints of worsening SOB, productive cough, and one episode of blood-tinged sputum. He was found to have exacerbation of underlying interstitial lung disease. COVID-19 negative.   Significant Events: 7/10- admission 7/15- intubation, OGT placement; bronchoscopy 7/16- RD assessment for TF 7/17- extubation, OGT removal 7/23- small bore NGT placement and TF initiation 7/24- NGT removed 7/25- diet advanced from NPO to Dysphagia 2, thin liquids   Diet was advanced on 7/25 and no intakes documented in RN flow sheet since that time. Patient was changed back to NPO earlier this AM. Able to talk with Velna Hatchet and RN and ongoing family discussions concerning [place] are being had; possible comfort feeds in the future but no plan for diet advancement or NGT replacement at this time. Notes indicate ongoing aspiration, including aspiration of secretions.   Per RN flow sheet, patient is a/o self only today. Per chart review, weight has been stable over the past 1 week.   Per notes: - acute on chronic hypoxic respiratory failure - chronic aspiration - afib with RVR - delirium    Labs reviewed; CBG: 144 mg/dl today, BUN: 66 mg/dl, creatinine: 2.17 mg/dl, Ca: 8.7 mg/dl,  Mg: 2.8 mg/dl, GFR: 27 ml/min. Medications reviewed; 1 mg IV folic acid/day, 40 mg IV lasix x2 doses 7/27, 60 mg solu-medrol BID, 100 mg IV thiamine/day.  IVF; D5 @ 30 ml/hr (122 kcal).    Diet Order:   Diet Order            Diet NPO time specified Except for: Ice Chips, Sips with Meds  Diet effective now              EDUCATION NEEDS:   No education needs have been identified at this time  Skin:  Skin Assessment: Reviewed RN Assessment  Last BM:  7/27  Height:   Ht Readings from Last 1 Encounters:  01/13/19 6' (1.829 m)    Weight:   Wt Readings from Last 1 Encounters:  01/25/19 98.3 kg    Ideal Body Weight:  80.9 kg  BMI:  Body mass index is 29.39 kg/m.  Estimated Nutritional Needs:   Kcal:  2015-2220 kcal  Protein:  110-120 grams  Fluid:  >/= 1.8 L/day     Jarome Matin, MS, RD, LDN, [facility] Inpatient Clinical Dietitian Pager # [phone] After hours/weekend pager # [phone]

## 2019-01-26 NOTE — Progress Notes (Addendum)
NAME:  Tanner Cox., MRN:  [id number], DOB:  [birth year], LOS: 47 ADMISSION DATE:  01/10/2019, CONSULTATION DATE:  01/05/2019 REFERRING MD:  Horris Latino, MD CHIEF COMPLAINT:  Shortness of breath, hemoptysis  Brief History   85 y/o M, remote smoker, from [place], was being treated for ILD/IPAF with CellCept and prednisone (Dr. Winona Legato at [facility] ) and Cosentyx for longstanding psoriatic arthritis admitted 7/10 for suspected "ILD flare".  Course complicated by delirium requiring mechanical ventilation 7/15. He was extubated 7/17 but had persistent delirium and increased oxygen requirements attributed to aspiration. He was treated with 11 days of antibiotics initially cefepime and then zosyn, BAL negative.    Past Medical History  COPD, AAA, CAD, Psoriatec arthritis, GERD, nephrolithiasis status post recent stent, wife reports today (7/25) hx of laryngeal CA  Significant Hospital Events   7/10 Admitted to [facility] 7/11 Near cardiac arrest, SVT, AMS. PCCM eval for ILD flare, steroids started 7/12 Mild delirium first noted by family 7/14 Delirium persists 7/15 Increasing O2 need, agitation, hypertensive crisis, CTH negative, new hematuria, labile BP 7/16 Bradycardia, precedex d/c'd. FIO2/PEEP decreased. CXR a little worse  7/17 Extubation. Stopped vancomycin. 7/19 Agitation remains the primary barrier to extubation.  7/20 Speech improved, still on high flow. Tx to Haldol over precedex. Added librium  7/21 Adding low dose seroquel, decreasing librium freq. OOB. Steroids transitioned to PO.   7/22 AF 160's, amio gtt started, febrile blood and urine Cx sent; resuming empiric HCAP coverage 7/26 Wife at bedside, seems to keep pt oriented, calm. HFNC 100%/40L. 7/27 100% FiO2/60L flow HFNC.    Consults:  PCCM  Procedures:  ETT 7/15 >> 7/17 Central Line 7/15 >> 7/20  Significant Diagnostic Tests:  CTA C/A/P 7/8 >> Emphysematous changes with peripheral reticular opacities CT Head 7/15 >>  negative  Micro Data:  COVID-19 7/10 >> neg MRSA by PCR 7/11>> neg BCX 7/10 >> negative UCx 7/10 >> negative BAL 7/15 >> negative BCx 7/22 >> negative UCx 7/22 >> negative  Antimicrobials:  Doxycycline 7/14 >> 7/15 Vanc 7/15 >> 7/17 Cefepime 7/10>> 7/17 Zosyn 7/15 >>7/21 Vanc 7/22 >> 7/28 Merrem 7/22 >> 7/28  Interim history/subjective:  Afebrile.  54m lasix with 2.4L UOP in last 24h, net neg 876 (remains +1.7L for admit).  RN reports pt coughed with PO intake this am and dropped his saturations into the 70's / slow to recover.  Patient did not sleep last night.      Objective   Blood pressure (!) 78/52, pulse (!) 103, temperature (!) 97.5 F (36.4 C), temperature source Axillary, resp. rate 20, height 6' (1.829 m), weight 98.3 kg, SpO2 99 %.    FiO2 (%):  [100 %] 100 %   Intake/Output Summary (Last 24 hours) at 01/26/2019 0759 Last data filed at 01/26/2019 [id number] per 24 hour  Intake 1528.96 ml  Output 2325 ml  Net -796.04 ml   Filed Weights   01/22/19 0455 01/24/19 0210 01/25/19 0438  Weight: 99.1 kg 97.9 kg 98.3 kg    Physical Exam: General: elderly male lying in bed, appears agitated / delirious    HEENT: MM pink/dry, [place] O2 Neuro: Awakens to voice, oriented to self, moves all extremities, delirious / picking at bed linens, unable to keep patient dressed CV: s1s2 irr irr, no m/r/g PULM: mild accessory muscle use, clear anterior, faint sibilant wheeze lower with crackles  GI: soft, bsx4 active  Extremities: warm/dry, no edema  Skin: no rashes or lesions  Resolved Hospital Problem list  Hemoptysis, Hypertensive Crisis, Hx of B12 deficiency   Assessment & Plan:   Acute on chronic hypoxemic respiratory failure, ILD exacerbation with small volume hemopytsis- aspiration event vs progression of ILD, doubt pulm edema.  Has been treated with prolonged course abx without significant change in imaging & culture negative.  P: Solumedrol 60 mg IV Q12 D7/7 abx  Hold  home cellcept  Wean FiO2 for sats >85% Pulmonary hygiene - IS, mobilize  PRN xopenex  No bipap given delirium  Change to NPO status given coughing / risk of increased aspiration.  Oral care + PRN ice chips   Paroxysmal AF  -intermittent rapid ventricular response and then also intermittent bradycardia P: Cardizem gtt > consider conversion once able to take PO's more reliably  PRN metoprolol  Avoid amiodarone given ILD No anticoagulation for now given hx of recent falls and hemoptysis   ICU Delirium ETOH withdrawal -possibly exacerbated by steroids/ consider infectious process or hypoxia P: PRN haldol for agitation Minimize sedation as able  PT / Mobilize  MVI, folate, thiamine  Promote sleep / wake cycle  Protein Calorie Malnutrition Dysphagia-pre-existing vocal cord paralysis P:  Appreciate SLP evaluation  Aspiration precautions   Fever 7/23  -nosocomial UTI versus HCAP P: Afebrile, abx to stop 7/28  L kidney stone s/p stenting, 60% Renal Artery Stenosis on L R atrophied kidney CKD, stage III  Hypernatremia, hyperchloremia, hypokalemia  P: Hold diuresis 7/28 Trend BMP / urinary output Replace electrolytes as indicated Avoid nephrotoxic agents, ensure adequate renal perfusion  Best practice:  Diet: Dysphagia 2 Pain/Anxiety/Delirium protocol (if indicated): PRN Haldol  VAP protocol (if indicated): N/A DVT prophylaxis: SCDs GI prophylaxis: PPI Glucose control: CBG q4h Mobility: PT/OT Code Status: DNR Family Communication: Will update wife on arrival 7/28. Further need to discuss goals of care.  Disposition: SDU; SNF rec if can wean O2, unlikely to wean.    Noe Gens, NP-C [place] [facility] Pgr: [phone] or if no answer [phone] 01/26/2019, 7:59 AM

## 2019-01-26 NOTE — Progress Notes (Signed)
Order to [employer] services received.  Please reorder if desire.  Note pt now on comfort feeding.   Luanna Salk, [place] [facility] SLP Acute Rehab Services Pager [phone] Office [phone]

## 2019-01-27 MED ORDER — MORPHINE SULFATE (PF) 2 MG/ML IV SOLN
0.5000 mg | INTRAVENOUS | Status: DC | PRN
  Administered 2019-01-27 – 2019-01-28 (×14): 2 mg via INTRAVENOUS
  Filled 2019-01-27 (×16): qty 1

## 2019-01-27 NOTE — Progress Notes (Signed)
NAME:  Dion Sibal., MRN:  [id number], DOB:  May 20, 1932, LOS: 73 ADMISSION DATE:  01/25/2019, CONSULTATION DATE:  01/07/2019 REFERRING MD:  Horris Latino, MD CHIEF COMPLAINT:  Shortness of breath, hemoptysis  Brief History   85 y/o M, remote smoker, from [place], was being treated for ILD/IPAF with CellCept and prednisone (Dr. Winona Legato at [facility] ) and Cosentyx for longstanding psoriatic arthritis admitted 7/10 for suspected "ILD flare".  Course complicated by delirium requiring mechanical ventilation 7/15. He was extubated 7/17 but had persistent delirium and increased oxygen requirements attributed to aspiration. He was treated with 11 days of antibiotics initially cefepime and then zosyn, BAL negative.    Past Medical History  COPD, AAA, CAD, Psoriatec arthritis, GERD, nephrolithiasis status post recent stent, wife reports today (7/25) hx of laryngeal CA  Significant Hospital Events   7/10 Admitted to [facility] 7/11 Near cardiac arrest, SVT, AMS. PCCM eval for ILD flare, steroids started 7/12 Mild delirium first noted by family 7/14 Delirium persists 7/15 Increasing O2 need, agitation, hypertensive crisis, CTH negative, new hematuria, labile BP 7/16 Bradycardia, precedex d/c'd. FIO2/PEEP decreased. CXR a little worse  7/17 Extubation. Stopped vancomycin. 7/19 Agitation remains the primary barrier to extubation.  7/20 Speech improved, still on high flow. Tx to Haldol over precedex. Added librium  7/21 Adding low dose seroquel, decreasing librium freq. OOB. Steroids transitioned to PO.   7/22 AF 160's, amio gtt started, febrile blood and urine Cx sent; resuming empiric HCAP coverage 7/26 Wife at bedside, seems to keep pt oriented, calm. HFNC 100%/40L. 7/27 100% FiO2/60L flow HFNC.   7/28 Transition to comfort measures  Consults:  PCCM  Procedures:  ETT 7/15 >> 7/17 Central Line 7/15 >> 7/20  Significant Diagnostic Tests:  CTA C/A/P 7/8 >> Emphysematous changes with peripheral  reticular opacities CT Head 7/15 >> negative  Micro Data:  COVID-19 7/10 >> neg MRSA by PCR 7/11>> neg BCX 7/10 >> negative UCx 7/10 >> negative BAL 7/15 >> negative BCx 7/22 >> negative UCx 7/22 >> negative  Antimicrobials:  Doxycycline 7/14 >> 7/15 Vanc 7/15 >> 7/17 Cefepime 7/10>> 7/17 Zosyn 7/15 >>7/21 Vanc 7/22 >> 7/28 Merrem 7/22 >> 7/28  Interim history/subjective:  RN reports pt received morphine overnight x3 (65m each). Pt continues to be restless / agitated.   Objective   Blood pressure (!) 174/154, pulse (!) 109, temperature (!) 97.4 F (36.3 C), temperature source Axillary, resp. rate (!) 22, height 6' (1.829 m), weight 98.3 kg, SpO2 (!) 89 %.    FiO2 (%):  [80 %-100 %] 80 %   Intake/Output Summary (Last 24 hours) at 01/27/2019 0757 Last data filed at 01/27/2019 04008Gross per 24 hour  Intake 857.08 ml  Output 900 ml  Net -42.92 ml   Filed Weights   01/22/19 0455 01/24/19 0210 01/25/19 0438  Weight: 99.1 kg 97.9 kg 98.3 kg    Physical Exam: General: elderly adult male lying in bed, agitated delirium    HEENT: MM pink/dry, no jvd Neuro: Awake, alert, disoriented, moves all extremities  CV: s1s2 irr irr, AF in 100's, no m/r/g PULM: tachypnea, mild abdominal accessory muscle use, lungs bilaterally clear anterior, bibasilar crackles  GI: soft, bsx4 active  Extremities: warm/dry, BLE pedal 1+ edema  Skin: no rashes or lesions  Resolved Hospital Problem list   Hemoptysis, Hypertensive Crisis, Hx of B12 deficiency   Assessment & Plan:   Acute on chronic hypoxemic respiratory failure, ILD exacerbation with small volume hemopytsis- aspiration event vs progression of ILD,  doubt pulm edema.  Has been treated with prolonged course abx without significant change in imaging & culture negative.  P: Stop steroids  Morphine for SOB / pain  PRN xopenex  Aspiration precautions with comfort feeding Oral care   Paroxysmal AF  -intermittent rapid ventricular  response and then also intermittent bradycardia P: Stop cardizem gtt  PRN metoprolol  No anticoagulation given hemoptysis / recent falls  ICU Delirium ETOH withdrawal -possibly exacerbated by steroids/ consider infectious process or hypoxia P: PRN haldol  Morphine for pain / SOB  Limit stimulation as able   Protein Calorie Malnutrition Dysphagia-pre-existing vocal cord paralysis P:  Aspiration precautions   Fever 7/23  -nosocomial UTI versus HCAP P: Completed abx  L kidney stone s/p stenting, 60% Renal Artery Stenosis on L R atrophied kidney CKD, stage III  Hypernatremia, hyperchloremia, hypokalemia  P: No further labs   Best practice:  Diet: Dysphagia 2 Pain/Anxiety/Delirium protocol (if indicated): PRN Haldol  VAP protocol (if indicated): N/A DVT prophylaxis: SCDs GI prophylaxis: PPI Glucose control: CBG q4h Mobility: PT/OT Code Status: DNR.  Full comfort measures.  Family Communication: Family updated 7/28.  Will update 7/29 on arrival.   Disposition: SDU, comfort measures.   Noe Gens, NP-C [place] [facility] Pgr: [phone] or if no answer [phone] 01/27/2019, 7:57 AM

## 2019-01-28 MED ORDER — MORPHINE BOLUS VIA INFUSION
1.0000 mg | INTRAVENOUS | Status: DC | PRN
  Filled 2019-01-28: qty 1

## 2019-01-28 MED ORDER — GENTAMICIN SULFATE 40 MG/ML IJ SOLN
5.0000 mg/kg | INTRAVENOUS | Status: DC
  Filled 2019-01-28: qty 10.75

## 2019-01-28 MED ORDER — MORPHINE 100MG IN NS 100ML (1MG/ML) PREMIX INFUSION
5.0000 mg/h | INTRAVENOUS | Status: DC
  Administered 2019-01-28: 16:00:00 5 mg/h via INTRAVENOUS
  Filled 2019-01-28 (×2): qty 100

## 2019-01-29 ENCOUNTER — Encounter (HOSPITAL_COMMUNITY): Admission: RE | Payer: Self-pay | Source: Home / Self Care

## 2019-01-29 ENCOUNTER — Ambulatory Visit (HOSPITAL_COMMUNITY): Admission: RE | Admit: 2019-01-29 | Payer: Medicare Other | Source: Home / Self Care | Admitting: Urology

## 2019-01-29 SURGERY — CYSTOURETEROSCOPY, WITH RETROGRADE PYELOGRAM AND STENT INSERTION
Anesthesia: General | Laterality: Left

## 2019-01-30 NOTE — Progress Notes (Signed)
Family notified of change in pattern of breathing.  Wife is in the parking lot.  Will contact daughters for visitation.     Noe Gens, NP-C [place] [facility] Pgr: [phone] or if no answer [phone] 30-Jan-2019, 11:02 AM

## 2019-01-30 NOTE — Progress Notes (Signed)
Patients pulse ox reading pulse rate of zero and not detecting any O2 saturations.  Alroy Dust RN and Benjamine Mola RN listen and feel for one minute for signs of life.  No heart or lung sounds auscultated and no pulse palpated.  Patient pronounced dead at 1710.  Patients wife and 2 daughter were at bedside.  Attending physician, Dr. Melvyn Novas, notified.  [facility] [facility] called, potential donor, and post mortem care completed.  37ml of morphine wasted in sink, witnessed by [employer].

## 2019-01-30 NOTE — Progress Notes (Signed)
NAME:  Xavier Munger., MRN:  [id number], DOB:  1932-01-19, LOS: 66 ADMISSION DATE:  01/04/2019, CONSULTATION DATE:  01/09/2019 REFERRING MD:  Horris Latino, MD CHIEF COMPLAINT:  Shortness of breath, hemoptysis  Brief History   85 y/o M, remote smoker, from [place], was being treated for ILD/IPAF with CellCept and prednisone (Dr. Winona Legato at [facility] ) and Cosentyx for longstanding psoriatic arthritis admitted 7/10 for suspected "ILD flare".  Course complicated by delirium requiring mechanical ventilation 7/15. He was extubated 7/17 but had persistent delirium and increased oxygen requirements attributed to aspiration. He was treated with 11 days of antibiotics initially cefepime and then zosyn, BAL negative.    Past Medical History  COPD, AAA, CAD, Psoriatec arthritis, GERD, nephrolithiasis status post recent stent, wife reports today (7/25) hx of laryngeal CA  Significant Hospital Events   7/10 Admitted to [facility] 7/11 Near cardiac arrest, SVT, AMS. PCCM eval for ILD flare, steroids started 7/12 Mild delirium first noted by family 7/14 Delirium persists 7/15 Increasing O2 need, agitation, hypertensive crisis, CTH negative, new hematuria, labile BP 7/16 Bradycardia, precedex d/c'd. FIO2/PEEP decreased. CXR a little worse  7/17 Extubation. Stopped vancomycin. 7/19 Agitation remains the primary barrier to extubation.  7/20 Speech improved, still on high flow. Tx to Haldol over precedex. Added librium  7/21 Adding low dose seroquel, decreasing librium freq. OOB. Steroids transitioned to PO.   7/22 AF 160's, amio gtt started, febrile blood and urine Cx sent; resuming empiric HCAP coverage 7/26 Wife at bedside, seems to keep pt oriented, calm. HFNC 100%/40L. 7/27 100% FiO2/60L flow HFNC.   7/28 Transition to comfort measures  Consults:  PCCM  Procedures:  ETT 7/15 >> 7/17 Central Line 7/15 >> 7/20  Significant Diagnostic Tests:  CTA C/A/P 7/8 >> Emphysematous changes with peripheral  reticular opacities CT Head 7/15 >> negative  Micro Data:  COVID-19 7/10 >> neg MRSA by PCR 7/11>> neg BCX 7/10 >> negative UCx 7/10 >> negative BAL 7/15 >> negative BCx 7/22 >> negative UCx 7/22 >> negative  Antimicrobials:  Doxycycline 7/14 >> 7/15 Vanc 7/15 >> 7/17 Cefepime 7/10>> 7/17 Zosyn 7/15 >>7/21 Vanc 7/22 >> 7/28 Merrem 7/22 >> 7/28  Interim history/subjective:  Afebrile.  RN reports several doses of morphine overnight for SOB/discomfort.  Remains on HFNC.    Objective   Blood pressure (!) 174/154, pulse (!) 118, temperature (!) 97.3 F (36.3 C), temperature source Axillary, resp. rate (!) 21, height 6' (1.829 m), weight 98.3 kg, SpO2 90 %.    FiO2 (%):  [80 %] 80 %   Intake/Output Summary (Last 24 hours) at 02-07-19 1005 Last data filed at 02/07/19 0500 Gross per 24 hour  Intake 47.58 ml  Output 325 ml  Net -277.42 ml   Filed Weights   01/22/19 0455 01/24/19 0210 01/25/19 0438  Weight: 99.1 kg 97.9 kg 98.3 kg    Physical Exam: General: elderly male lying in bed, hypoactive delirium  HEENT: MM pink/dry, [place] O2 Neuro: eyes open, makes eye contact when name called  CV: s1s2 irr irr, AF on monitor with rate up to 140's, no m/r/g PULM:  Abdominal accessory muscle use, lungs clear anteriorly  GI: soft, bsx4 active  Extremities: warm/dry, no edema  Skin: no rashes or lesions, fragile / dry skin  Resolved Hospital Problem list   Hemoptysis, Hypertensive Crisis, Hx of B12 deficiency   Assessment & Plan:   Acute on chronic hypoxemic respiratory failure, ILD exacerbation with small volume hemopytsis- aspiration event vs progression of ILD,  doubt pulm edema.  Has been treated with prolonged course abx without significant change in imaging & culture negative.  P: Morphine PRN for pain, SOB  PRN xopenex  Aspiration precautions with comfort feeding  Oral care per protocol  No escalation of care   Paroxysmal AF  -intermittent rapid ventricular response  and then also intermittent bradycardia P: PRN metoprolol  No anticoagulation with prior hemoptysis / comfort measures  ICU Delirium ETOH withdrawal -possibly exacerbated by steroids/ consider infectious process or hypoxia P: PRN haldol  Morphine as above  Limit stimulation / promote sleep-wake cycle   Protein Calorie Malnutrition Dysphagia-pre-existing vocal cord paralysis P:  Aspiration precautions   Fever 7/23  -nosocomial UTI versus HCAP P: No further escalation of care  L kidney stone s/p stenting, 60% Renal Artery Stenosis on L R atrophied kidney CKD, stage III  Hypernatremia, hyperchloremia, hypokalemia  P: No further labs    Best practice:  Diet: Dysphagia 2 Pain/Anxiety/Delirium protocol (if indicated): PRN Haldol  VAP protocol (if indicated): N/A DVT prophylaxis: SCDs GI prophylaxis: PPI Glucose control: CBG q4h Mobility: PT/OT Code Status: DNR.  Full comfort measures.  Family Communication: Wife updated via phone 7/30.  Anticipate hours to days. Disposition: SDU, comfort measures.   Noe Gens, NP-C [place] [facility] Pgr: [phone] or if no answer [phone] 30-Jan-2019, 10:05 AM

## 2019-01-30 DEATH — deceased

## 2019-02-04 ENCOUNTER — Telehealth: Payer: Self-pay | Admitting: Internal Medicine

## 2019-02-04 NOTE — Telephone Encounter (Incomplete)
02/04/19 I received D/c from [place] to Pulmonary for Dr. Melvyn Novas to sign. PWR

## 2019-02-05 ENCOUNTER — Telehealth: Payer: Self-pay

## 2019-02-05 NOTE — Telephone Encounter (Signed)
Received signed dc back from Doctor Wert.  I called the funeral home to let them know the dc was mailed to vital records per the funeral home request.

## 2019-02-11 LAB — FUNGUS CULTURE WITH STAIN

## 2019-02-11 LAB — FUNGAL ORGANISM REFLEX

## 2019-02-11 LAB — FUNGUS CULTURE RESULT

## 2019-02-18 ENCOUNTER — Telehealth: Payer: Self-pay | Admitting: *Deleted

## 2019-02-18 NOTE — Telephone Encounter (Signed)
Received original D/C from [facility] [employer]-D/C forwarded to Dr.Wert for signature. (Pulmonary)

## 2019-02-22 NOTE — Telephone Encounter (Signed)
Received original D/C signed. D/C forwarded to [facility] as requested.

## 2019-02-26 LAB — ACID FAST CULTURE WITH REFLEXED SENSITIVITIES (MYCOBACTERIA): Acid Fast Culture: NEGATIVE

## 2019-03-02 NOTE — Discharge Summary (Signed)
Physician Discharge Summary         Patient ID: Tanner Cox. MRN: [id number] DOB/AGE: 10/26/1931 85 y.o.  Admit date: 01/29/2019 Discharge date: January 30, 2019  Discharge Diagnoses:    1) IPF endstage with refractory hypoxemia 2) Toxic metabolic encephalopathy 3) PAF 4) Protein malnutrition 5) CKD stage III   85 y/o M, remote smoker, from [place], was being treated for ILD/IPAF with CellCept and prednisone (Dr. Winona Legato at [facility] ) and Cosentyx for longstanding psoriatic arthritis admitted 7/10 for suspected "ILD flare".  Course complicated by delirium requiring mechanical ventilation 7/15. He was extubated 7/17 but had persistent delirium and increased oxygen requirements attributed to aspiration. He was treated with 11 days of antibiotics initially cefepime and then zosyn, BAL negative.    Family made the difficult decision post extubation to shift to palliative care as a full NCB and he expired with effective palliative measures in place 02/20/2019.    Christinia Gully, MD Pulmonary and [place] [phone] After 5:30 PM or weekends, use [employer] [phone]

## 2019-03-02 DEATH — deceased

## 2019-11-26 IMAGING — DX PORTABLE CHEST - 1 VIEW
1 series · 1 of 1 positions shown · non-contrast
Comparison: 01/18/2019

CLINICAL DATA: Respiratory failure

EXAM:
PORTABLE CHEST 1 VIEW

[chest ap]
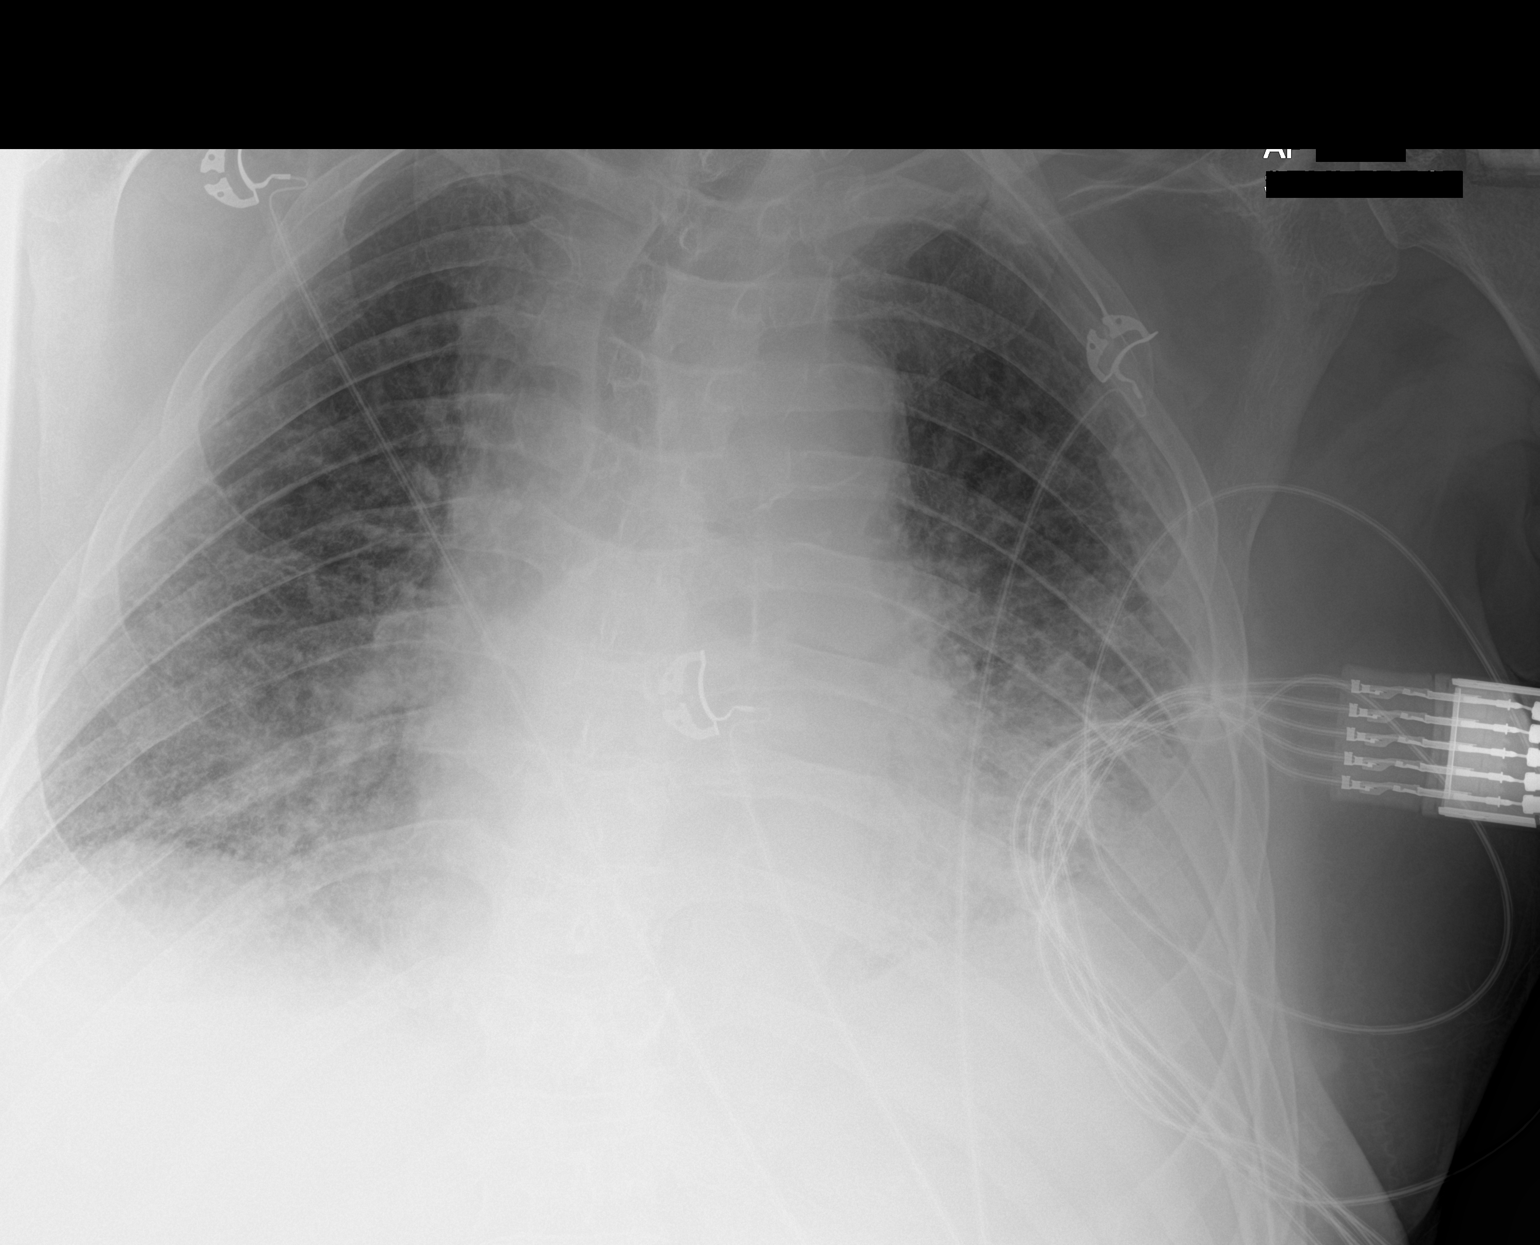

[1 of 1 positions shown; findings below may reference images not displayed]

FINDINGS: Cardiomegaly. Bilateral perihilar and lower lobe airspace opacities
with interstitial prominence, likely edema. Small left pleural
effusion. No significant change in appearance since prior study.

Interval removal of right central line.
IMPRESSION: Continued bilateral diffuse airspace opacities, predominantly
perihilar and lower lobes, likely edema. Small left pleural
effusion. No real change.

## 2019-12-01 IMAGING — DX PORTABLE CHEST - 1 VIEW
1 series · 1 of 1 positions shown · non-contrast
Comparison: Chest x-ray 01/22/2019.

CLINICAL DATA: 87-year-old male with history of hypoxia. Former
smoker.

EXAM:
PORTABLE CHEST 1 VIEW

[chest ap]
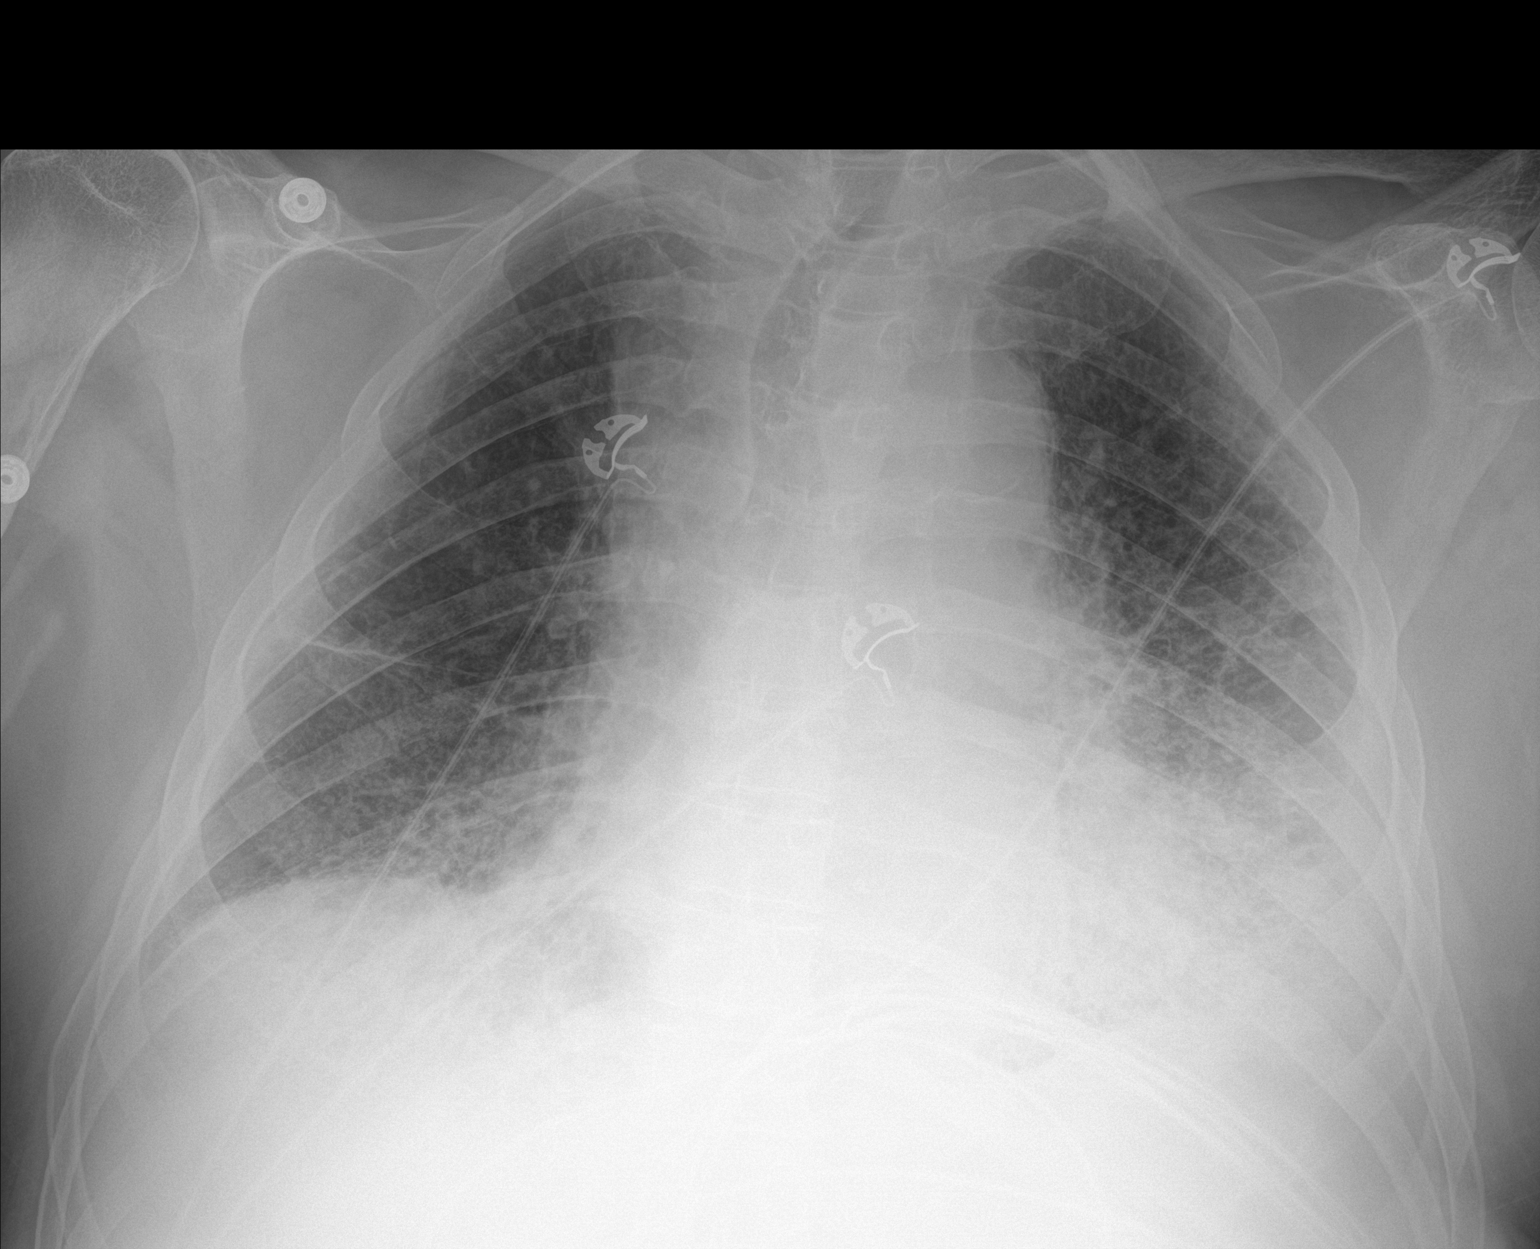

[1 of 1 positions shown; findings below may reference images not displayed]

FINDINGS: Patchy multifocal interstitial and airspace disease again noted
throughout the lungs bilaterally (left greater than right), most
confluent in the left lower lobe. Overall, aeration is slightly
improved compared to the recent prior study, particularly in the
right mid to lower lung. Moderate left pleural effusion is
unchanged. No definite right pleural effusion. No evidence of
pulmonary edema. Heart size appears upper limits of normal. Upper
mediastinal contours are distorted by patient positioning. Aortic
atherosclerosis.
IMPRESSION: 1. Overall, there is slightly improved aeration, particularly in the
right mid to lower lung. Otherwise, the radiographic appearance the
chest is essentially unchanged, with what appears to be a background
of mild interstitial lung disease with superimposed multilobar
pneumonia and moderate left parapneumonic pleural effusion.

## 2021-01-18 ENCOUNTER — Encounter (HOSPITAL_COMMUNITY): Payer: Self-pay
